# Patient Record
Sex: Female | Born: 1975 | Race: White | Hispanic: No | Marital: Married | Smoking: Never smoker
Health system: Southern US, Community
[De-identification: ages and names within clinical notes are randomized; demographics above are authoritative.]

## PROBLEM LIST (undated history)

## (undated) DIAGNOSIS — F32A Depression, unspecified: Secondary | ICD-10-CM

## (undated) DIAGNOSIS — E669 Obesity, unspecified: Secondary | ICD-10-CM

## (undated) DIAGNOSIS — F329 Major depressive disorder, single episode, unspecified: Secondary | ICD-10-CM

## (undated) HISTORY — DX: Obesity, unspecified: E66.9

## (undated) HISTORY — DX: Depression, unspecified: F32.A

## (undated) HISTORY — DX: Major depressive disorder, single episode, unspecified: F32.9

---

## 2008-07-13 ENCOUNTER — Ambulatory Visit: Payer: Self-pay | Admitting: Occupational Medicine

## 2009-02-15 ENCOUNTER — Ambulatory Visit: Payer: Self-pay | Admitting: Family Medicine

## 2010-04-26 ENCOUNTER — Ambulatory Visit: Payer: Self-pay | Admitting: Family Medicine

## 2010-04-26 ENCOUNTER — Other Ambulatory Visit: Admission: RE | Admit: 2010-04-26 | Discharge: 2010-04-26 | Payer: Self-pay | Admitting: Family Medicine

## 2010-04-26 DIAGNOSIS — F329 Major depressive disorder, single episode, unspecified: Secondary | ICD-10-CM

## 2010-04-26 DIAGNOSIS — E663 Overweight: Secondary | ICD-10-CM | POA: Insufficient documentation

## 2010-04-27 ENCOUNTER — Telehealth (INDEPENDENT_AMBULATORY_CARE_PROVIDER_SITE_OTHER): Payer: Self-pay | Admitting: *Deleted

## 2010-04-28 ENCOUNTER — Telehealth: Payer: Self-pay | Admitting: Family Medicine

## 2010-05-08 ENCOUNTER — Telehealth: Payer: Self-pay | Admitting: Family Medicine

## 2010-06-05 ENCOUNTER — Ambulatory Visit: Payer: Self-pay | Admitting: Family Medicine

## 2010-11-21 NOTE — Letter (Signed)
Summary: Depression Questionnaire  Depression Questionnaire   Imported By: Lanelle Bal 06/22/2010 09:36:51  _____________________________________________________________________  External Attachment:    Type:   Image     Comment:   External Document

## 2010-11-21 NOTE — Progress Notes (Signed)
Summary: Effexor is too expensive  Phone Note Call from Patient   Caller: Patient Summary of Call: Pt LMOM stating generic effexor is still too expensive bc she doesn't have any Rx coverage. Pt states her pharmacist recommended zoloft, wellbutrin or paxil for cost. Pt would like to know if you will Rx any of these meds. Please advise. Initial call taken by: Payton Spark CMA,  May 08, 2010 2:05 PM  Follow-up for Phone Call        will change her to sertraline. Follow-up by: Seymour Bars DO,  May 08, 2010 2:29 PM    New/Updated Medications: SERTRALINE HCL 50 MG TABS (SERTRALINE HCL) 1/2 tab by mouth daily x 1 wk then 1 tab by mouth daily Prescriptions: SERTRALINE HCL 50 MG TABS (SERTRALINE HCL) 1/2 tab by mouth daily x 1 wk then 1 tab by mouth daily  #30 x 1   Entered and Authorized by:   Seymour Bars DO   Signed by:   Seymour Bars DO on 05/08/2010   Method used:   Electronically to        [facility]* (retail)       [address]       [place], [place]  [address]       Ph: [phone]       Fax: [phone]   RxID:   [id number]   Appended Document: Effexor is too expensive Pt aware of the above

## 2010-11-21 NOTE — Progress Notes (Signed)
Summary: Cymbalta too expensive  Phone Note Call from Patient   Caller: Patient Summary of Call: Pt called stating Cymbalta will cost her $317/month and is unable to afford it. Pt would like Rx changed to something different. Please advise. Initial call taken by: Payton Spark CMA,  April 28, 2010 2:14 PM  Follow-up for Phone Call        changed her to the most equivalent RX -- generic effexor.  let me know if this is OK. Follow-up by: Seymour Bars DO,  April 28, 2010 2:21 PM    New/Updated Medications: VENLAFAXINE HCL 37.5 MG XR24H-CAP (VENLAFAXINE HCL) 1 capsule by mouth qAM x 1 wk then increase to 2 capsules by mouth qAM Prescriptions: VENLAFAXINE HCL 37.5 MG XR24H-CAP (VENLAFAXINE HCL) 1 capsule by mouth qAM x 1 wk then increase to 2 capsules by mouth qAM  #60 x 1   Entered and Authorized by:   Seymour Bars DO   Signed by:   Seymour Bars DO on 04/28/2010   Method used:   Electronically to        [facility]* (retail)       [address]       [place], [place]  [address]       Ph: [phone]       Fax: [phone]   RxID:   [id number]   Appended Document: Cymbalta too expensive Pt aware

## 2010-11-21 NOTE — Assessment & Plan Note (Signed)
Summary: f/u mood   Vital Signs:  Patient profile:   35 year old female Height:      67.5 inches Weight:      205 pounds BMI:     31.75 O2 Sat:      96 % on Room air Pulse rate:   64 / minute BP sitting:   120 / 82  (left arm) Cuff size:   large  Vitals Entered By: Payton Spark CMA (June 05, 2010 1:23 PM)  O2 Flow:  Room air CC: F/U depression   Primary Care Taiya Nutting:  Nani Gasser MD  CC:  F/U depression.  History of Present Illness: Rhonda Stein presents for f/u depression.  She was started on Sertraline about 5 wks ago.  She feels a lot better.  She denies any adverse SEs.  She is taking it at night.  Sleeping well.  She is on 50 mg/ day.  She feels much less irritable.  Her husband notices a big improvement in her mood.  Energy level has improved.  Current Medications (verified): 1)  Sertraline Hcl 50 Mg Tabs (Sertraline Hcl) .... Take 1 Tab By Mouth Once Daily  Allergies (verified): No Known Drug Allergies  Past History:  Past Medical History: G2P2 obesity depression  Social History: Reviewed history from 04/26/2010 and no changes required. 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 Has 2 kids. No exercise.   Finished school - teaching  Review of Systems Psych:  Denies anxiety, depression, easily angered, easily tearful, irritability, panic attacks, suicidal thoughts/plans, and thoughts of violence.  Physical Exam  General:  alert, well-developed, well-nourished, and well-hydrated.  obese Skin:  color normal.   Psych:  good eye contact, not anxious appearing, and not depressed appearing.     Impression & Recommendations:  Problem # 1:  DEPRESSION (ICD-311) PHQ9 improved from a 13--> 4 in 5 wks.  Doing great on Sertraline 50 mg/ day w/o adverse SE.  Will RF her for another 4 months and have her f/u with Dr Linford Arnold in 3 mos.  Call if any problems.   Her updated medication list for this problem includes:    Sertraline Hcl 50 Mg Tabs  (Sertraline hcl) .Marland Kitchen... Take 1 tab by mouth once daily  Complete Medication List: 1)  Sertraline Hcl 50 Mg Tabs (Sertraline hcl) .... Take 1 tab by mouth once daily  Patient Instructions: 1)  You are doing great on Sertraline. 2)  Continue on current dosage. 3)  REturn for f/u mood in 3 mos. Prescriptions: SERTRALINE HCL 50 MG TABS (SERTRALINE HCL) Take 1 tab by mouth once daily  #30 x 3   Entered and Authorized by:   Seymour Bars DO   Signed by:   Seymour Bars DO on 06/05/2010   Method used:   Electronically to        [facility]* (retail)       [address]       [place], [place]  [address]       Ph: [phone]       Fax: [phone]   RxID:   [id number]

## 2010-11-21 NOTE — Progress Notes (Signed)
Summary: cymbalta  Phone Note From Pharmacy   Caller: Patient Caller: [facility]* Summary of Call: Pharm called on behalf of Pt asking if Cymbalta can be changed to generic celexa due to cost. Please advise. Initial call taken by: Payton Spark CMA,  April 27, 2010 3:26 PM  Follow-up for Phone Call        no, I specifially wanted her to be on an SNRI.   Follow-up by: Seymour Bars DO,  April 27, 2010 8:41 PM  Additional Follow-up for Phone Call Additional follow up Details #1::        [facility] informing pharm. Additional Follow-up by: Payton Spark CMA,  April 28, 2010 9:29 AM

## 2010-11-21 NOTE — Assessment & Plan Note (Signed)
Summary: CPE with pap   Vital Signs:  Patient profile:   35 year old female LMP:     04/12/2010 Height:      67.5 inches Weight:      203.75 pounds BMI:     31.55 Temp:     97.6 degrees F oral Pulse rate:   92 / minute Pulse rhythm:   regular Resp:     18 per minute BP sitting:   117 / 87  (right arm) Cuff size:   large  Vitals Entered By: Mervin Kung CMA Duncan Dull) (April 26, 2010 9:35 AM) Is Patient Diabetic? No LMP (date): 04/12/2010     Enter LMP: 04/12/2010   Primary Care Provider:  Nani Gasser MD   History of Present Illness: 35 yo WF presents for CPE with pap smear.  She has a Mirena IUD from 2007 for contraception.  Her periods are light.    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  With all the stress of the move, she feels depressed.  Never on meds before.  Her husband is her support system.  Denies fam hx of colon or breast cancer or of premature heart dz.  Last Tetanus unknown, declined today.  She is due for fasting labs.      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 No exercise.   Finished school - teaching  Review of Systems       The patient complains of weight gain.  The patient denies anorexia, fever, weight loss, vision loss, decreased hearing, hoarseness, chest pain, syncope, dyspnea on exertion, peripheral edema, prolonged cough, headaches, hemoptysis, abdominal pain, melena, hematochezia, severe indigestion/heartburn, hematuria, incontinence, genital sores, muscle weakness, suspicious skin lesions, transient blindness, difficulty walking, depression, unusual weight change, abnormal bleeding, enlarged lymph nodes, angioedema, breast masses, and testicular  masses.    Physical Exam  General:  alert, well-developed, well-nourished, well-hydrated, and overweight-appearing.   Head:  normocephalic and atraumatic.   Eyes:  pupils equal, pupils round, and pupils reactive to light.   Ears:  no external deformities.   Nose:  no nasal discharge.   Mouth:  good dentition and pharynx pink and moist.   Neck:  no masses.   Breasts:  No mass, nodules, thickening, tenderness, bulging, retraction, inflamation, nipple discharge or skin changes noted.   Lungs:  Normal respiratory effort, chest expands symmetrically. Lungs are clear to auscultation, no crackles or wheezes. Heart:  Normal rate and regular rhythm. S1 and S2 normal without gallop, murmur, click, rub or other extra sounds. Abdomen:  Bowel sounds positive,abdomen soft and non-tender without masses, organomegaly or hernias noted. Genitalia:  Pelvic Exam:        External: normal female genitalia without lesions or masses        Vagina: normal without lesions or masses        Cervix: normal without lesions or masses; IUD string visualized        Adnexa: normal bimanual exam without masses or fullness        Uterus: normal by palpation        Pap smear: performed Pulses:  2+ radial and pedal pulses Extremities:  no LE edema Skin:  color normal.   Cervical  Nodes:  No lymphadenopathy noted Psych:  good eye contact, not anxious appearing, and flat affect.     Impression & Recommendations:  Problem # 1:  ROUTINE GYNECOLOGICAL EXAMINATION (ICD-V72.31) Keeping healthy checklist for women reviewed today. BP at goal.  BMI 31= class I obesity. Counseled on healthy diet, regular exercise. MVI + Calcium with D daily encouraged. Update fasting labs. Td historically UTD. IUD for contraception, to be removed in 1 yr...she is considering getting another Mirena vs husband getting a vasectomy. PHQ-9 done for depression = 13 c/w moderate depression.  Start on cymbalta and f/u in 4 wks.  Call if any  problems.  Complete Medication List: 1)  Cymbalta 30 Mg Cpep (Duloxetine hcl) .Marland Kitchen.. 1 capsule by mouth once a day x 7 days then 2 capsules by mouth once a day  Other Orders: T-Comprehensive Metabolic Panel [phone]) T-Lipid Profile ([id number]) T-TSH ([id number])  Patient Instructions: 1)  Start on Cymbalta 30 mg once a day for depression x 7 days. 2)  After 7 days, increase to 2 capsules by mouth (60 mg) once a day.  Take this in the morning. 3)  Call if any problems. 4)  Return for f/u depression in 4 wks. Prescriptions: CYMBALTA 30 MG CPEP (DULOXETINE HCL) 1 capsule by mouth once a day x 7 days then 2 capsules by mouth once a day  #60 x 1   Entered and Authorized by:   Seymour Bars DO   Signed by:   Seymour Bars DO on 04/26/2010   Method used:   Electronically to        [facility] [phone]* (retail)       [address]       [place], [place]  [address]       Ph: [phone] or [phone]       Fax: [phone]   RxID:   [id number]      Vital Signs:  Patient Profile:   35 year old female LMP:     04/12/2010 Height:     67.5 inches Weight:      203.75 pounds BMI:     31.55 Temp:     97.6 degrees F oral Pulse rate:   92 / minute Pulse rhythm:   regular Resp:     18 per minute BP sitting:   117 / 87 Cuff size:   large                 Current Allergies (reviewed today): No known allergies

## 2010-11-21 NOTE — Letter (Signed)
Summary: Depression Questionnaire  Depression Questionnaire   Imported By: Lanelle Bal 06/13/2010 10:06:25  _____________________________________________________________________  External Attachment:    Type:   Image     Comment:   External Document

## 2010-12-05 ENCOUNTER — Ambulatory Visit: Payer: Self-pay | Admitting: Family Medicine

## 2011-01-07 ENCOUNTER — Encounter: Payer: Self-pay | Admitting: Family Medicine

## 2011-01-16 ENCOUNTER — Ambulatory Visit: Payer: Self-pay | Admitting: Family Medicine

## 2011-01-17 ENCOUNTER — Encounter: Payer: Self-pay | Admitting: Obstetrics & Gynecology

## 2011-01-17 ENCOUNTER — Encounter (INDEPENDENT_AMBULATORY_CARE_PROVIDER_SITE_OTHER): Payer: PRIVATE HEALTH INSURANCE | Admitting: Obstetrics & Gynecology

## 2011-01-17 DIAGNOSIS — Z30433 Encounter for removal and reinsertion of intrauterine contraceptive device: Secondary | ICD-10-CM

## 2011-01-23 NOTE — Assessment & Plan Note (Signed)
NAMESHAKENA, CALLARI NO.:  [id number]  MEDICAL RECORD NO.:  [id number]          PATIENT TYPE:  LOCATION:  [facility] [place]           FACILITY:  PHYSICIAN:  Allie Bossier, MD             DATE OF BIRTH:  DATE OF SERVICE:  01/17/2011                                 CLINIC NOTE  Ms. Washinton is a 35 year old married lady.  She is a married white G3 P2 A1 who has had an IUD (a Mirena IUD) for approximately 5 years.  She would like to have it replaced. Dr. Linford Arnold is her family doctor, and she is up to date on her Pap smear and her routine health maintenance.  She has no complaints about the Mirena and wishes to continue this as her form of birth control. Bimanual exam revealed a normal size and shape, anteverted, mobile uterus and nonenlarged adnexa.  A speculum was placed, her cervix was covered with iodine, and the IUD was removed.  I then was able to place atraumatically (without tenaculum) a new Mirena IUD, the strings were cut to approximately 3 cm.  She tolerated the procedure well.  She will follow up in a year or when her annual exam is needed, or sooner as necessary.     Allie Bossier, MD    MCD/MEDQ  D:  01/17/2011  T:  01/17/2011  Job:  [id number]

## 2011-09-04 ENCOUNTER — Other Ambulatory Visit: Payer: Self-pay | Admitting: Endodontics

## 2012-02-08 ENCOUNTER — Encounter: Payer: PRIVATE HEALTH INSURANCE | Admitting: Family Medicine

## 2012-02-15 ENCOUNTER — Encounter: Payer: PRIVATE HEALTH INSURANCE | Admitting: Family Medicine

## 2012-03-14 ENCOUNTER — Encounter: Payer: Self-pay | Admitting: Family Medicine

## 2012-03-14 ENCOUNTER — Other Ambulatory Visit (HOSPITAL_COMMUNITY)
Admission: RE | Admit: 2012-03-14 | Discharge: 2012-03-14 | Disposition: A | Discharge status: Home / Self Care | Payer: PRIVATE HEALTH INSURANCE | Source: Ambulatory Visit | Attending: Family Medicine | Admitting: Family Medicine

## 2012-03-14 ENCOUNTER — Ambulatory Visit (INDEPENDENT_AMBULATORY_CARE_PROVIDER_SITE_OTHER): Payer: PRIVATE HEALTH INSURANCE | Admitting: Family Medicine

## 2012-03-14 VITALS — BP 126/83 | HR 71 | Ht 67.5 in | Wt 175.0 lb

## 2012-03-14 DIAGNOSIS — Z01419 Encounter for gynecological examination (general) (routine) without abnormal findings: Secondary | ICD-10-CM

## 2012-03-14 DIAGNOSIS — Z23 Encounter for immunization: Secondary | ICD-10-CM

## 2012-03-14 MED ORDER — PHENTERMINE HCL 37.5 MG PO TABS: 37.5000 mg | ORAL_TABLET | Freq: Every day | ORAL | Status: DC

## 2012-03-14 NOTE — Patient Instructions (Signed)
Start a regular exercise program and make sure you are eating a healthy diet Try to eat 4 servings of dairy a day  Your vaccines are up to date.  We will call you with your lab results. If you don't here from us in about a week then please give us a call at [phone].    

## 2012-03-14 NOTE — Progress Notes (Signed)
Subjective:     Rhonda Stein is a 36 y.o. female and is here for a comprehensive physical exam. The patient reports she wants to continue to work on her weight.  She has lost 30 lbs in the last 6 months. She previsouly took phentermine 1/2 tab every other day. Says shtink about taking it again for 1-2 more months to see if can get the last 15 lbs off.  Working out 5 times a week.  Says really eats healthy.   History   Social History  . Marital Status: Married    Spouse Name: N/A    Number of Children: N/A  . Years of Education: N/A   Occupational History  . Not on file.   Social History Main Topics  . Smoking status: Never Smoker   . Smokeless tobacco: Not on file  . Alcohol Use: Not on file  . Drug Use: No  . Sexually Active: Not on file   Other Topics Concern  . Not on file   Social History Narrative   Has been working out.     Health Maintenance  Topic Date Due  . Tetanus/tdap  10/20/1993  . Influenza Vaccine  07/22/2012  . Pap Smear  04/26/2013    The following portions of the patient's history were reviewed and updated as appropriate: allergies, current medications, past family history, past medical history, past social history, past surgical history and problem list.  Review of Systems Pertinent items are noted in HPI.   Objective:    BP 126/83  Pulse 71  Ht 5' 7.5" (1.715 m)  Wt 175 lb (79.379 kg)  BMI 27.00 kg/m2  LMP 02/13/2012 General appearance: alert, cooperative and appears stated age Head: Normocephalic, without obvious abnormality, atraumatic Eyes: conj clear, EOMi, PEERLA Ears: normal TM's and external ear canals both ears Nose: Nares normal. Septum midline. Mucosa normal. No drainage or sinus tenderness. Throat: lips, mucosa, and tongue normal; teeth and gums normal Neck: no adenopathy, no carotid bruit, no JVD, supple, symmetrical, trachea midline and thyroid not enlarged, symmetric, no tenderness/mass/nodules Back: symmetric, no curvature. ROM  normal. No CVA tenderness. Lungs: clear to auscultation bilaterally Breasts: normal appearance, no masses or tenderness Heart: regular rate and rhythm, S1, S2 normal, no murmur, click, rub or gallop Abdomen: soft, non-tender; bowel sounds normal; no masses,  no organomegaly Pelvic: cervix normal in appearance, external genitalia normal, no adnexal masses or tenderness, no cervical motion tenderness, rectovaginal septum normal, uterus normal size, shape, and consistency and vagina normal without discharge Extremities: extremities normal, atraumatic, no cyanosis or edema Pulses: 2+ and symmetric Skin: Skin color, texture, turgor normal. No rashes or lesions Lymph nodes: Cervical, supraclavicular, and axillary nodes normal. Neurologic: Grossly normal    Assessment:    Healthy female exam.     Plan:     See After Visit Summary for Counseling Recommendations  Start a regular exercise program and make sure you are eating a healthy diet Try to eat 4 servings of dairy a day or take a calcium supplement (500mg  twice a day). Your vaccines are up to date.   Tdap given today.   Screening labwork.    Weight loss-overhead and represcribed the phentermine. She typically takes about half of a tab every other day and says this actually works well. We will renew this. Next on her importance of this in conjunction with regular exercise and healthy diet which she is already doing. Followup in one month for blood pressure and weight check if she  decides she wants a refill.

## 2013-01-07 ENCOUNTER — Telehealth: Payer: Self-pay | Admitting: *Deleted

## 2013-01-07 DIAGNOSIS — Z0000 Encounter for general adult medical examination without abnormal findings: Secondary | ICD-10-CM

## 2013-01-07 NOTE — Telephone Encounter (Signed)
Pt calls and states she wants to get labs done ? Check hormone levels and routine labs. States she is tired all the time, weight gain and turning 38. Will make appointment with you for CPE once gets labs done

## 2013-01-07 NOTE — Telephone Encounter (Signed)
OK for TSH, CMP, lipids, fsh, lh, estradiol, progesterone.  Can use v70.0 and fatigue.  Have her go ahead and schedule the appointment today. I am a little leery if they want lab work done and they haven't  scheduled an appointment yet.

## 2013-01-08 NOTE — Telephone Encounter (Signed)
Pt notified and labs faxed. Barry Dienes, LPN

## 2013-01-13 LAB — COMPREHENSIVE METABOLIC PANEL
AST: 15 U/L (ref 0–37)
Albumin: 4.3 g/dL (ref 3.5–5.2)
Alkaline Phosphatase: 52 U/L (ref 39–117)
Chloride: 106 mEq/L (ref 96–112)
Glucose, Bld: 90 mg/dL (ref 70–99)
Potassium: 4.5 mEq/L (ref 3.5–5.3)
Sodium: 140 mEq/L (ref 135–145)
Total Protein: 6.6 g/dL (ref 6.0–8.3)

## 2013-01-13 LAB — LIPID PANEL
LDL Cholesterol: 135 mg/dL — ABNORMAL HIGH (ref 0–99)
Triglycerides: 62 mg/dL (ref <150)

## 2013-01-13 LAB — FOLLICLE STIMULATING HORMONE: FSH: 6.4 m[IU]/mL

## 2013-04-22 ENCOUNTER — Other Ambulatory Visit (HOSPITAL_COMMUNITY)
Admission: RE | Admit: 2013-04-22 | Discharge: 2013-04-22 | Disposition: A | Discharge status: Home / Self Care | Payer: PRIVATE HEALTH INSURANCE | Source: Ambulatory Visit | Attending: Family Medicine | Admitting: Family Medicine

## 2013-04-22 ENCOUNTER — Ambulatory Visit (INDEPENDENT_AMBULATORY_CARE_PROVIDER_SITE_OTHER): Payer: PRIVATE HEALTH INSURANCE | Admitting: Family Medicine

## 2013-04-22 ENCOUNTER — Encounter: Payer: Self-pay | Admitting: Family Medicine

## 2013-04-22 VITALS — BP 122/85 | HR 78 | Ht 68.0 in | Wt 192.0 lb

## 2013-04-22 DIAGNOSIS — Z01419 Encounter for gynecological examination (general) (routine) without abnormal findings: Secondary | ICD-10-CM

## 2013-04-22 DIAGNOSIS — H6123 Impacted cerumen, bilateral: Secondary | ICD-10-CM

## 2013-04-22 DIAGNOSIS — Z1151 Encounter for screening for human papillomavirus (HPV): Secondary | ICD-10-CM | POA: Insufficient documentation

## 2013-04-22 DIAGNOSIS — R87615 Unsatisfactory cytologic smear of cervix: Secondary | ICD-10-CM

## 2013-04-22 DIAGNOSIS — R635 Abnormal weight gain: Secondary | ICD-10-CM

## 2013-04-22 DIAGNOSIS — F329 Major depressive disorder, single episode, unspecified: Secondary | ICD-10-CM

## 2013-04-22 DIAGNOSIS — F3289 Other specified depressive episodes: Secondary | ICD-10-CM

## 2013-04-22 DIAGNOSIS — H6120 Impacted cerumen, unspecified ear: Secondary | ICD-10-CM

## 2013-04-22 DIAGNOSIS — Z124 Encounter for screening for malignant neoplasm of cervix: Secondary | ICD-10-CM

## 2013-04-22 MED ORDER — FLUOXETINE HCL 10 MG PO CAPS: ORAL_CAPSULE | ORAL | Status: DC

## 2013-04-22 NOTE — Patient Instructions (Signed)
Keep up a regular exercise program and make sure you are eating a healthy diet Try to eat 4 servings of dairy a day, or if you are lactose intolerant take a calcium with vitamin D daily.  Your vaccines are up to date.   

## 2013-04-22 NOTE — Progress Notes (Addendum)
Subjective:    Patient ID: Rhonda Stein, female    DOB: [birth year], 38 y.o.   MRN: [id number]  HPI    Review of Systems     Objective:   Physical Exam        Assessment & Plan:   Subjective:     Rhonda Stein is a 37 y.o. female and is here for a comprehensive physical exam. The patient reports no problems.  Feels like her ears are impacted and feels he rhearing is decreased. Has to get her ears cleaned out in the past.    She is really struggling emotionally. She has a lot of days where she feels down. She cries easily. She's also more stressed and irritable. Her life is very stressful. 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 She's also having some difficulty with her marriage. She has decreased exercise. She has difficulty with losing weight. She really struggles with this. She would like to have hormone testing done. We can order this in March but unfortunately her shunt progesterone were not run, though her [facility] and LH were normal. We will check this again today. She denies any suicidal thoughts.  History   Social History  . Marital Status: Married    Spouse Name: N/A    Number of Children: N/A  . Years of Education: N/A   Occupational History  . Not on file.   Social History Main Topics  . Smoking status: Never Smoker   . Smokeless tobacco: Not on file  . Alcohol Use: Not on file  . Drug Use: No  . Sexually Active: Not on file   Other Topics Concern  . Not on file   Social History Narrative   Has been working out.     Health Maintenance  Topic Date Due  . Influenza Vaccine  06/22/2013  . Pap Smear  03/15/2015  . Tetanus/tdap  03/14/2022    The following portions of the patient's history were reviewed and updated as appropriate: allergies, current medications, past family history, past medical history, past social history, past surgical history and problem list.  Review of Systems A comprehensive review  of systems was negative.   Objective:    BP 122/85  Pulse 78  Ht 5\' 8"  (1.727 m)  Wt 192 lb (87.091 kg)  BMI 29.2 kg/m2 General appearance: alert, cooperative and appears stated age Head: atraumatic Eyes: conj clear, EOMi, PEERLA Ears: Bilateral canals blocked with cerumen. Nose: Nares normal. Septum midline. Mucosa normal. No drainage or sinus tenderness. Throat: lips, mucosa, and tongue normal; teeth and gums normal Neck: no adenopathy, no carotid bruit, no JVD, supple, symmetrical, trachea midline and thyroid not enlarged, symmetric, no tenderness/mass/nodules Back: symmetric, no curvature. ROM normal. No CVA tenderness. Lungs: clear to auscultation bilaterally Breasts: normal appearance, no masses or tenderness Heart: regular rate and rhythm, S1, S2 normal, no murmur, click, rub or gallop Abdomen: soft, non-tender; bowel sounds normal; no masses,  no organomegaly Pelvic: cervix normal in appearance, external genitalia normal, no adnexal masses or tenderness, no cervical motion tenderness, rectovaginal septum normal, uterus normal size, shape, and consistency, vagina normal without discharge and visualized her IUD strings Extremities: extremities normal, atraumatic, no cyanosis or edema Pulses: 2+ and symmetric Skin: Skin color, texture, turgor normal. No rashes or lesions Lymph nodes: Cervical, supraclavicular, and axillary nodes normal. Neurologic: Grossly normal    Assessment:    Healthy female exam.  Plan:     See After Visit Summary for Counseling Recommendations  Keep up a regular exercise program and make sure you are eating a healthy diet Try to eat 4 servings of dairy a day, or if you are lactose intolerant take a calcium with vitamin D daily.  Your vaccines are up to date.   Acute situational depression-PHQ- 9 score of 12.  We discussed options. Counseling versus medication. She is at the point where she would like to consider a medication. We'll start  fluoxetine. We discussed potential side effects of the medication. She was in the back in one month so we can see how she's doing and adjust her dose if needed. She would also like to have her estrogen, progesterone and testosterone checked as well.  Cerumen impaction-ears were irrigated. Patient tolerated well.

## 2013-05-04 LAB — PROGESTERONE: Progesterone: 3.8 ng/mL

## 2013-05-04 LAB — ESTRADIOL: Estradiol: 72.6 pg/mL

## 2014-02-26 ENCOUNTER — Encounter: Payer: Self-pay | Admitting: Family Medicine

## 2014-02-26 ENCOUNTER — Ambulatory Visit (INDEPENDENT_AMBULATORY_CARE_PROVIDER_SITE_OTHER): Payer: PRIVATE HEALTH INSURANCE | Admitting: Family Medicine

## 2014-02-26 VITALS — BP 108/69 | HR 74 | Wt 208.0 lb

## 2014-02-26 DIAGNOSIS — M722 Plantar fascial fibromatosis: Secondary | ICD-10-CM

## 2014-02-26 DIAGNOSIS — E663 Overweight: Secondary | ICD-10-CM

## 2014-02-26 DIAGNOSIS — H6120 Impacted cerumen, unspecified ear: Secondary | ICD-10-CM

## 2014-02-26 MED ORDER — PHENTERMINE HCL 37.5 MG PO CAPS: 37.5000 mg | ORAL_CAPSULE | ORAL | Status: DC

## 2014-02-26 NOTE — Progress Notes (Signed)
   Subjective:    Patient ID: Rhonda Stein, female    DOB: 1975/10/28, 38 y.o.   MRN: [id number]  HPI Cerumen impaction - she gets recurrent cerumen impaction of the ears. She's here today to have them cleaned out.  She's also had some heel pain on her right foot for a while. She says it's not debilitating but does bother her from time to time. She went to the [employer] shoe store and actually bought an insert. She does feel like that has actually helped but wants to know what all she can do to help with her pain. It's worse when she first gets out of bed and steps on her feet in the morning. Near her heel but is also having a little bit of pain in the ball of the foot. No trauma or injury. She does wear a lot of flip-flops.  Obesity, class I-she really wants to work on her weight. She says she's gained 30 pounds in about 15 months. She's taken phentermine before and tolerated it well without any chest pain or palpitations or shortness of breath. She said she's been under a lot of stress and has been stress eating. She really wants something to help curb her appetite. She's not currently exercising. Review of Systems     Objective:   Physical Exam  Constitutional: She is oriented to person, place, and time. She appears well-developed and well-nourished.  HENT:  Head: Normocephalic and atraumatic.  Cardiovascular: Normal rate, regular rhythm and normal heart sounds.   Pulmonary/Chest: Effort normal and breath sounds normal.  Neurological: She is alert and oriented to person, place, and time.  Skin: Skin is warm and dry.  Psychiatric: She has a normal mood and affect. Her behavior is normal.          Assessment & Plan:  Cerumen impaction - bilateral irrigation performed today. Patient tolerated well. Return if needed. May want to use over-the-counter D. proximal drops periodically just to keep the wax soft and it may help facilitate the wax coming to the external canal.  Plantar  fascititis - discussed treatment options. Recommend stretches. Handout provided. Icing the area was frozen water bottle. Also recommend anti-inflammatory. Recommend that she quit wearing flip-flops and continue to wear good supportive shoes with her inserts. If not noticing significant improvement over 3-4 weeks then recommend she see our sports medicine doctor for possible injection.  Obesity, class I-will restart phentermine but will need to be in combination with an exercise regimen and a diet program. Ask reminded her that this cannot be used solely it has to be part of a comprehensive plan. Followup in one month for blood pressure and weight check with nurse. Stop immediately if any chest pain or shortness of breath.

## 2014-03-26 ENCOUNTER — Ambulatory Visit (INDEPENDENT_AMBULATORY_CARE_PROVIDER_SITE_OTHER): Payer: PRIVATE HEALTH INSURANCE | Admitting: Family Medicine

## 2014-03-26 ENCOUNTER — Encounter: Payer: Self-pay | Admitting: *Deleted

## 2014-03-26 VITALS — BP 128/80 | HR 83 | Wt 190.0 lb

## 2014-03-26 DIAGNOSIS — R635 Abnormal weight gain: Secondary | ICD-10-CM

## 2014-03-26 MED ORDER — PHENTERMINE HCL 37.5 MG PO CAPS: 37.5000 mg | ORAL_CAPSULE | ORAL | Status: DC

## 2014-03-26 NOTE — Progress Notes (Signed)
   Subjective:    Patient ID: Rhonda Stein, female    DOB: 1976/01/10, 38 y.o.   MRN: [id number]  HPI   Patient doing well on appetite suppressant.  Here for nurse visit, weight, BP, HR check.  Denies problems with insomnia, heart palpitations or tremors.  Satisfied with weight loss thus far and is working on [employer] and regular exercise.     Review of Systems     Objective:   Physical Exam        Assessment & Plan:  Abnormal weight gain-she has lost 18 pounds and is doing fantastic on the medication without any side effects and blood pressure is well-controlled. Continue current regimen. Refill sent today. Followup in one month for blood pressure and weight check.

## 2014-04-26 ENCOUNTER — Ambulatory Visit: Payer: PRIVATE HEALTH INSURANCE

## 2014-08-19 ENCOUNTER — Encounter: Payer: Self-pay | Admitting: Family Medicine

## 2014-08-19 ENCOUNTER — Ambulatory Visit (INDEPENDENT_AMBULATORY_CARE_PROVIDER_SITE_OTHER): Payer: 59 | Admitting: Family Medicine

## 2014-08-19 VITALS — BP 134/78 | HR 83 | Ht 68.0 in | Wt 184.0 lb

## 2014-08-19 DIAGNOSIS — K2100 Gastro-esophageal reflux disease with esophagitis, without bleeding: Secondary | ICD-10-CM

## 2014-08-19 DIAGNOSIS — S2341XA Sprain of ribs, initial encounter: Secondary | ICD-10-CM

## 2014-08-19 DIAGNOSIS — G4700 Insomnia, unspecified: Secondary | ICD-10-CM

## 2014-08-19 MED ORDER — OMEPRAZOLE 40 MG PO CPDR: 40.0000 mg | DELAYED_RELEASE_CAPSULE | Freq: Every day | ORAL | Status: DC

## 2014-08-19 NOTE — Patient Instructions (Signed)
Food Choices for Gastroesophageal Reflux Disease When you have gastroesophageal reflux disease (GERD), the foods you eat and your eating habits are very important. Choosing the right foods can help ease the discomfort of GERD. WHAT GENERAL GUIDELINES DO I NEED TO FOLLOW?  Choose fruits, vegetables, whole grains, low-fat dairy products, and low-fat meat, fish, and poultry.  Limit fats such as oils, salad dressings, butter, nuts, and avocado.  Keep a food diary to identify foods that cause symptoms.  Avoid foods that cause reflux. These may be different for different people.  Eat frequent small meals instead of three large meals each day.  Eat your meals slowly, in a relaxed setting.  Limit fried foods.  Cook foods using methods other than frying.  Avoid drinking alcohol.  Avoid drinking large amounts of liquids with your meals.  Avoid bending over or lying down until 2-3 hours after eating. WHAT FOODS ARE NOT RECOMMENDED? The following are some foods and drinks that may worsen your symptoms: Vegetables Tomatoes. Tomato juice. Tomato and spaghetti sauce. Chili peppers. Onion and garlic. Horseradish. Fruits Oranges, grapefruit, and lemon (fruit and juice). Meats High-fat meats, fish, and poultry. This includes hot dogs, ribs, ham, sausage, salami, and bacon. Dairy Whole milk and chocolate milk. Sour cream. Cream. Butter. Ice cream. Cream cheese.  Beverages Coffee and tea, with or without caffeine. Carbonated beverages or energy drinks. Condiments Hot sauce. Barbecue sauce.  Sweets/Desserts Chocolate and cocoa. Donuts. Peppermint and spearmint. Fats and Oils High-fat foods, including [place] fries and potato chips. Other Vinegar. Strong spices, such as black pepper, white pepper, red pepper, cayenne, curry powder, cloves, ginger, and chili powder. The items listed above may not be a complete list of foods and beverages to avoid. Contact your dietitian for more  information. Document Released: 10/08/2005 Document Revised: 10/13/2013 Document Reviewed: 08/12/2013 [facility] Patient Information 2015 [place], [place]. This information is not intended to replace advice given to you by your health care provider. Make sure you discuss any questions you have with your health care provider.

## 2014-08-19 NOTE — Progress Notes (Signed)
Subjective:    Patient ID: Rhonda Stein, female    DOB: Jul 31, 1976, 38 y.o.   MRN: [id number]  HPI 6 weeks of feeling her food passing through her chest and then having more lower chest discomfort after eating. Says can feel it reflux up sometims.  It comes and goes.  Occ takes a half a phentermine. She denies any major weight changes.  Says can feel her breathing in her throat. No ST. No fever, chils or sweat. No cough.  No URI.   Left sided CP - has been sore the the last 2 months. No trauma or injury.  + tender to touch.  She's not sure when it started. [place] She said sometimes it seems to get better and then other times it seems more sore.  Not sleeping well.  She feels like her stress levels are really high. 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   She did try some over-the-counter Benadryl and some melatonin. She did try it for couple of nights but wasn't convinced it was working or not. She does have a new dog as well that required surgery get up every 2-3 hours at night to take it to the potty.  Review of Systems     Objective:   Physical Exam  Constitutional: She is oriented to person, place, and time. She appears well-developed and well-nourished.  HENT:  Head: Normocephalic and atraumatic.  Right Ear: External ear normal.  Left Ear: External ear normal.  Nose: Nose normal.  Mouth/Throat: Oropharynx is clear and moist.  TMs and canals are clear.   Eyes: Conjunctivae and EOM are normal. Pupils are equal, round, and reactive to light.  Neck: Neck supple. No thyromegaly present.  Cardiovascular: Normal rate, regular rhythm and normal heart sounds.   Pulmonary/Chest: Effort normal and breath sounds normal. She has no wheezes.  Abdominal: Soft. Bowel sounds are normal. She exhibits no distension and no mass. There is no tenderness.  There is no rebound and no guarding.  Lymphadenopathy:    She has no cervical adenopathy.  Neurological: She is alert and oriented to person, place, and time.  Skin: Skin is warm and dry.  Psychiatric: She has a normal mood and affect.          Assessment & Plan:  GERD with esophagitis-recommended treatment of Prilosec 40 mg daily. Take 20-30 minutes before first meal of the day. Whenever foods to avoid and provided additional handout with information about reflux. If she's not improving over the next 2 weeks and please give [place] a call back. I suspect she will notice improvement within the next 4-5 days. Next  Insomnia-clearly related to her increased stress levels. We discussed strategies to lower her stress levels. Also reviewed some sleep hygiene such as going to bed at the same time and waking up at the same time and creating a good bedtime routine for herself. Certainly it's worth trying over-the-counter medication such as Benadryl, melatonin, and valerian root. These can often times be very helpful. If she's not still sleeping well and is really struggling a week consider a sleep aid such as trazodone in the future.  Left-sided chest pain most consistent with costochondritis. Unable to palpate right over the edge of the rib where it attaches to the sternum and she's very tender. Explained that  we typically use NSAIDs to help treat this. They with her increase in reflux I think we should hold off. Just avoid any straining or heavy lifting and try to give this time to heal. Certainly is not improving or if her pain becomes more persistent then we can get an x-ray.

## 2015-04-23 ENCOUNTER — Emergency Department (INDEPENDENT_AMBULATORY_CARE_PROVIDER_SITE_OTHER)
Admission: EM | Admit: 2015-04-23 | Discharge: 2015-04-23 | Disposition: A | Discharge status: Home / Self Care | Payer: 59 | Source: Home / Self Care | Attending: Family Medicine | Admitting: Family Medicine

## 2015-04-23 ENCOUNTER — Encounter: Payer: Self-pay | Admitting: Emergency Medicine

## 2015-04-23 DIAGNOSIS — R21 Rash and other nonspecific skin eruption: Secondary | ICD-10-CM

## 2015-04-23 DIAGNOSIS — J040 Acute laryngitis: Secondary | ICD-10-CM

## 2015-04-23 MED ORDER — MAGIC MOUTHWASH W/LIDOCAINE: 5.0000 mL | Freq: Three times a day (TID) | ORAL | Status: DC | PRN

## 2015-04-23 MED ORDER — BENZONATATE 100 MG PO CAPS: 100.0000 mg | ORAL_CAPSULE | Freq: Three times a day (TID) | ORAL | Status: DC

## 2015-04-23 NOTE — Discharge Instructions (Signed)
You may take Ibuprofen (Motrin) every 6-8 hours for fever and pain  Alternate with Tylenol  You may take Tylenol every 4-6 hours as needed for fever and pain  Follow-up with your primary care provider next week for recheck of symptoms if not improving.  Be sure to drink plenty of fluids and rest, at least 8hrs of sleep a night, preferably more while you are sick. Please got to the ER  if you cannot keep down fluids/signs of dehydration, fever not reducing with Tylenol, difficulty breathing/wheezing, stiff neck, worsening condition, or other concerns (see below)

## 2015-04-23 NOTE — ED Notes (Signed)
Patient presents to [facility] with C/O sore throat with cough cold and congestion for two days.

## 2015-04-23 NOTE — ED Provider Notes (Signed)
CSN: [id number]     Arrival date & time 04/23/15  2585 History   First MD Initiated Contact with Patient 04/23/15 [phone]     Chief Complaint  Patient presents with  . Sore Throat   (Consider location/radiation/quality/duration/timing/severity/associated sxs/prior Treatment) HPI  Pt is a 39yo female presenting to UC with c/o gradually worsening sore throat with change in voice and dry cough with chest tightness for 2 days.  Pt reports getting back from  [facility] yesterday and notes the weather was hot and very dry.  States she was yelling and screaming a lot during her vacation, having fun. Throat pain is gradually worsening, moderate in severity today.  States also thinks she got sun poisoning on her arms as she has developed a red pruritic rash that is mild in severity.  She has not tried any medications at home for her rash but she has tried ibuprofen for her throat pain.  Minimal relief.  No sick contacts that she knows of.  Denies difficulty breathing or swallowing but does note throat pain is worse with swallowing.  Denies hx of asthma and is not a smoker.   Past Medical History  Diagnosis Date  . Depression   . Obesity    History reviewed. No pertinent past surgical history. History reviewed. No pertinent family history. History  Substance Use Topics  . Smoking status: Never Smoker   . Smokeless tobacco: Not on file  . Alcohol Use: Not on file   OB History    No data available     Review of Systems  Constitutional: Positive for fatigue. Negative for fever, chills, diaphoresis and appetite change.  HENT: Positive for sore throat and voice change. Negative for congestion, ear pain, postnasal drip, rhinorrhea, sinus pressure, tinnitus and trouble swallowing.   Respiratory: Positive for cough and chest tightness. Negative for shortness of breath, wheezing and stridor.   Cardiovascular: Negative for chest pain, palpitations and leg swelling.  Gastrointestinal: Negative for nausea,  vomiting, abdominal pain and diarrhea.  Musculoskeletal: Negative for neck pain and neck stiffness.  Skin: Positive for color change and rash. Negative for pallor and wound.       Redness on bilateral arms  Neurological: Positive for light-headedness. Negative for dizziness, syncope, weakness, numbness and headaches.    Allergies  Review of patient's allergies indicates no known allergies.  Home Medications   Prior to Admission medications   Medication Sig Start Date End Date Taking? Authorizing Provider  Alum & Mag Hydroxide-Simeth (MAGIC MOUTHWASH W/LIDOCAINE) SOLN Take 5 mLs by mouth 3 (three) times daily as needed for mouth pain. May gargle and spit or swallow 04/23/15   Noland Fordyce, PA-C  benzonatate (TESSALON) 100 MG capsule Take 1 capsule (100 mg total) by mouth every 8 (eight) hours. 04/23/15   Noland Fordyce, PA-C  levonorgestrel (MIRENA) 20 MCG/24HR IUD 1 each by Intrauterine route once.    Historical Provider, MD  omeprazole (PRILOSEC) 40 MG capsule Take 1 capsule (40 mg total) by mouth daily. 08/19/14   Hali Marry, MD   BP 142/86 mmHg  Pulse 70  Temp(Src) 98.6 F (37 C) (Oral)  Resp 16  Ht 5' 8"  (1.727 m)  Wt 191 lb 4 oz (86.75 kg)  BMI 29.09 kg/m2  SpO2 100% Physical Exam  Constitutional: She appears well-developed and well-nourished. No distress.  HENT:  Head: Normocephalic and atraumatic.  Right Ear: Hearing, tympanic membrane, external ear and ear canal normal.  Left Ear: Hearing, tympanic membrane, external ear and ear  canal normal.  Nose: Nose normal.  Mouth/Throat: Uvula is midline and mucous membranes are normal. Posterior oropharyngeal erythema present. No oropharyngeal exudate, posterior oropharyngeal edema or tonsillar abscesses.  Eyes: Conjunctivae are normal. No scleral icterus.  Neck: Normal range of motion. Neck supple. No JVD present. No tracheal deviation present. No thyromegaly present.  Voice is hoarse but not "hot potato" sounding. No  stridor.  Cardiovascular: Normal rate, regular rhythm and normal heart sounds.   Pulmonary/Chest: Effort normal and breath sounds normal. No stridor. No respiratory distress. She has no wheezes. She has no rales. She exhibits no tenderness.  Abdominal: Soft. Bowel sounds are normal. She exhibits no distension and no mass. There is no tenderness. There is no rebound and no guarding.  Musculoskeletal: Normal range of motion.  Lymphadenopathy:    She has no cervical adenopathy.  Neurological: She is alert.  Skin: Skin is warm and dry. Rash noted. She is not diaphoretic. There is erythema.  Bilateral arms, dorsal aspect: diffuse erythematous maculopapular rash. No induration or fluctuance. Non-tender. No involvement of palms of hands or web spaces. No other rashes beyond arms.  Nursing note and vitals reviewed.   ED Course  Procedures (including critical care time) Labs Review Labs Reviewed - No data to display  Imaging Review No results found.   MDM   1. Laryngitis, acute   2. Rash and nonspecific skin eruption     Pt c/o sore throat, dry cough, chest tightness and rash vs sunburn on bilateral arms.  Hx and exam most c/w laryngitis and nondescript rash w/o concern for cellulitis on both arms.  Per Centor Score, pt is low risk for strep, no test performed today.  Will have pt f/u in 3-4 days for recheck of symptoms if not improving. Provided pt education on laryngitis, encouraged vocal rest along with rest and fluids.  Advised pt to use acetaminophen and ibuprofen as needed for fever and pain. Return precautions provided. Pt verbalized understanding and agreement with tx plan.     Noland Fordyce, PA-C 04/23/15 1129

## 2015-04-26 ENCOUNTER — Telehealth: Payer: Self-pay

## 2015-04-26 MED ORDER — HYDROCODONE-HOMATROPINE 5-1.5 MG/5ML PO SYRP: 5.0000 mL | ORAL_SOLUTION | Freq: Every evening | ORAL | Status: DC | PRN

## 2015-04-26 NOTE — Telephone Encounter (Signed)
Script written for hycodan cough syrup but will have to pick up rx since has hydrocodone in it.

## 2015-04-26 NOTE — Telephone Encounter (Signed)
Patient was seen over the weekend for a cough. She was given Tessalon for her cough. The Tessalon is not helping with her cough and it is keeping her up at night. She would like a different cough medication.

## 2015-04-26 NOTE — Telephone Encounter (Signed)
Patient advised.

## 2015-05-02 ENCOUNTER — Emergency Department (INDEPENDENT_AMBULATORY_CARE_PROVIDER_SITE_OTHER)
Admission: EM | Admit: 2015-05-02 | Discharge: 2015-05-02 | Disposition: A | Discharge status: Home / Self Care | Payer: 59 | Source: Home / Self Care | Attending: Family Medicine | Admitting: Family Medicine

## 2015-05-02 ENCOUNTER — Encounter: Payer: Self-pay | Admitting: Emergency Medicine

## 2015-05-02 DIAGNOSIS — J209 Acute bronchitis, unspecified: Secondary | ICD-10-CM

## 2015-05-02 MED ORDER — AZITHROMYCIN 250 MG PO TABS: 250.0000 mg | ORAL_TABLET | Freq: Every day | ORAL | Status: DC

## 2015-05-02 MED ORDER — SALINE SPRAY 0.65 % NA SOLN: 1.0000 | NASAL | Status: DC | PRN

## 2015-05-02 MED ORDER — PREDNISONE 20 MG PO TABS: ORAL_TABLET | ORAL | Status: DC

## 2015-05-02 NOTE — ED Notes (Signed)
Was here 04/23/15 and today for cough that has persisted.

## 2015-05-02 NOTE — Discharge Instructions (Signed)
You may take 400-676m Ibuprofen (Motrin) every 6-8 hours for fever and pain  Alternate with Tylenol  You may take 5086mTylenol every 4-6 hours as needed for fever and pain  Follow-up with your primary care provider next week for recheck of symptoms if not improving.  Be sure to drink plenty of fluids and rest, at least 8hrs of sleep a night, preferably more while you are sick. Return urgent care or go to closest ER if you cannot keep down fluids/signs of dehydration, fever not reducing with Tylenol, difficulty breathing/wheezing, stiff neck, worsening condition, or other concerns (see below)  Be sure to take antibiotic as prescribed and complete the entire course even if he started to feel better to help prevent recurrent infection. You may continue to take cough syrup as prescribed by your primary care provider.  Remember that it does cause drowsiness.  Do not drive or drink alcohol while taking. Acute Bronchitis Bronchitis is inflammation of the airways that extend from the windpipe into the lungs (bronchi). The inflammation often causes mucus to develop. This leads to a cough, which is the most common symptom of bronchitis.  In acute bronchitis, the condition usually develops suddenly and goes away over time, usually in a couple weeks. Smoking, allergies, and asthma can make bronchitis worse. Repeated episodes of bronchitis may cause further lung problems.  CAUSES Acute bronchitis is most often caused by the same virus that causes a cold. The virus can spread from person to person (contagious) through coughing, sneezing, and touching contaminated objects. SIGNS AND SYMPTOMS   Cough.   Fever.   Coughing up mucus.   Body aches.   Chest congestion.   Chills.   Shortness of breath.   Sore throat.  DIAGNOSIS  Acute bronchitis is usually diagnosed through a physical exam. Your health care provider will also ask you questions about your medical history. Tests, such as chest  X-rays, are sometimes done to rule out other conditions.  TREATMENT  Acute bronchitis usually goes away in a couple weeks. Oftentimes, no medical treatment is necessary. Medicines are sometimes given for relief of fever or cough. Antibiotic medicines are usually not needed but may be prescribed in certain situations. In some cases, an inhaler may be recommended to help reduce shortness of breath and control the cough. A cool mist vaporizer may also be used to help thin bronchial secretions and make it easier to clear the chest.  HOME CARE INSTRUCTIONS  Get plenty of rest.   Drink enough fluids to keep your urine clear or pale yellow (unless you have a medical condition that requires fluid restriction). Increasing fluids may help thin your respiratory secretions (sputum) and reduce chest congestion, and it will prevent dehydration.   Take medicines only as directed by your health care provider.  If you were prescribed an antibiotic medicine, finish it all even if you start to feel better.  Avoid smoking and secondhand smoke. Exposure to cigarette smoke or irritating chemicals will make bronchitis worse. If you are a smoker, consider using nicotine gum or skin patches to help control withdrawal symptoms. Quitting smoking will help your lungs heal faster.   Reduce the chances of another bout of acute bronchitis by washing your hands frequently, avoiding people with cold symptoms, and trying not to touch your hands to your mouth, nose, or eyes.   Keep all follow-up visits as directed by your health care provider.  SEEK MEDICAL CARE IF: Your symptoms do not improve after 1 week of treatment.  SEEK IMMEDIATE MEDICAL CARE IF:  You develop an increased fever or chills.   You have chest pain.   You have severe shortness of breath.  You have bloody sputum.   You develop dehydration.  You faint or repeatedly feel like you are going to pass out.  You develop repeated vomiting.  You  develop a severe headache. MAKE SURE YOU:   Understand these instructions.  Will watch your condition.  Will get help right away if you are not doing well or get worse. Document Released: 11/15/2004 Document Revised: 02/22/2014 Document Reviewed: 03/31/2013 [facility] Patient Information 2015 [place], [place]. This information is not intended to replace advice given to you by your health care provider. Make sure you discuss any questions you have with your health care provider.

## 2015-05-02 NOTE — ED Provider Notes (Signed)
CSN: [id number]     Arrival date & time 05/02/15  1233 History   First MD Initiated Contact with Patient 05/02/15 1307     Chief Complaint  Patient presents with  . Cough   (Consider location/radiation/quality/duration/timing/severity/associated sxs/prior Treatment) HPI Patient is a 39 year old female presenting to urgent care with complaint of persistent cough that keeps her up all night despite the use of Hycodan cough syrup called in by her PCP, Dr. Charise Carwin.  Patient has been sick for last week and a half and was seen on 04/23/15 for symptoms consistent with laryngitis.  Patient states her sore throat and voice have improved significantly since then.  Patient was concern with persistent cough and generalized fatigue.  Denies difficulty breathing or chest pain at this time.  No history of asthma.  Denies fevers, chills, nausea, vomiting or diarrhea.  Patient is here with her teenage daughter who is also sick with a sore throat and dry cough.  Patient denies recent travel.  Denies tick bites or rashes.  Past Medical History  Diagnosis Date  . Depression   . Obesity    History reviewed. No pertinent past surgical history. History reviewed. No pertinent family history. History  Substance Use Topics  . Smoking status: Never Smoker   . Smokeless tobacco: Not on file  . Alcohol Use: Not on file   OB History    No data available     Review of Systems  Constitutional: Positive for fatigue. Negative for fever and chills.  HENT: Positive for congestion and sore throat. Negative for trouble swallowing and voice change.   Respiratory: Positive for cough. Negative for shortness of breath.   Cardiovascular: Negative for chest pain and palpitations.  Gastrointestinal: Negative for nausea, vomiting, abdominal pain and diarrhea.  Genitourinary: Negative for dysuria, urgency, hematuria and flank pain.  All other systems reviewed and are negative.   Allergies  Review of patient's allergies  indicates no known allergies.  Home Medications   Prior to Admission medications   Medication Sig Start Date End Date Taking? Authorizing Provider  Alum & Mag Hydroxide-Simeth (MAGIC MOUTHWASH W/LIDOCAINE) SOLN Take 5 mLs by mouth 3 (three) times daily as needed for mouth pain. May gargle and spit or swallow 04/23/15   Noland Fordyce, PA-C  azithromycin (ZITHROMAX) 250 MG tablet Take 1 tablet (250 mg total) by mouth daily. Take first 2 tablets together, then 1 every day until finished. 05/02/15   Noland Fordyce, PA-C  benzonatate (TESSALON) 100 MG capsule Take 1 capsule (100 mg total) by mouth every 8 (eight) hours. 04/23/15   Noland Fordyce, PA-C  HYDROcodone-homatropine (HYCODAN) 5-1.5 MG/5ML syrup Take 5 mLs by mouth at bedtime as needed for cough. 04/26/15   Hali Marry, MD  levonorgestrel (MIRENA) 20 MCG/24HR IUD 1 each by Intrauterine route once.    Historical Provider, MD  omeprazole (PRILOSEC) 40 MG capsule Take 1 capsule (40 mg total) by mouth daily. 08/19/14   Hali Marry, MD  predniSONE (DELTASONE) 20 MG tablet 3 tabs po day one, then 2 po daily x 4 days 05/02/15   Noland Fordyce, PA-C  sodium chloride (OCEAN) 0.65 % SOLN nasal spray Place 1 spray into both nostrils as needed for congestion. 05/02/15   Noland Fordyce, PA-C   BP 113/72 mmHg  Pulse 79  Temp(Src) 98.4 F (36.9 C) (Oral)  Resp 16  Ht 5' 8"  (1.727 m)  Wt 188 lb (85.276 kg)  BMI 28.59 kg/m2  SpO2 98% Physical Exam  Constitutional: She  appears well-developed and well-nourished. No distress.  HENT:  Head: Normocephalic and atraumatic.  Right Ear: Hearing, tympanic membrane, external ear and ear canal normal.  Left Ear: Hearing, tympanic membrane, external ear and ear canal normal.  Nose: Nose normal.  Mouth/Throat: Uvula is midline, oropharynx is clear and moist and mucous membranes are normal.  Eyes: Conjunctivae are normal. No scleral icterus.  Neck: Normal range of motion.  Cardiovascular: Normal rate,  regular rhythm and normal heart sounds.   Pulmonary/Chest: Effort normal and breath sounds normal. No stridor. No respiratory distress. She has no wheezes. She has no rales. She exhibits no tenderness.  Mild intermittent dry cough. No respiratory distress. No rhonchi.  Abdominal: Soft. Bowel sounds are normal. She exhibits no distension and no mass. There is no tenderness. There is no rebound and no guarding.  Musculoskeletal: Normal range of motion.  Lymphadenopathy:    She has no cervical adenopathy.  Neurological: She is alert.  Skin: Skin is warm and dry. She is not diaphoretic.  Nursing note and vitals reviewed.   ED Course  Procedures (including critical care time) Labs Review Labs Reviewed - No data to display  Imaging Review No results found.   MDM   1. Acute bronchitis, unspecified organism     Patient is a 39 year old female presenting to urgent care with persistent cough that keeps her up at night.  Patient was seen 9 days ago, diagnosed with laryngitis.  Voice has improved significantly.  Lungs are clear.  Patient is afebrile.  No rhonchi.  No respiratory distress.  No concern for pneumonia.  However, due to persistent cough.  Will start patient on prednisone taper as well as azithromycin for acute bronchitis.  Home care instructions provided including encouraged patient to drink plenty of fluids and get plenty of rest. Advised to f/u with PCP in 1 week for recheck of symptoms if not improving. Return precautions provided. Pt verbalized understanding and agreement with tx plan.     Noland Fordyce, PA-C 05/02/15 [phone]

## 2015-05-09 ENCOUNTER — Ambulatory Visit (INDEPENDENT_AMBULATORY_CARE_PROVIDER_SITE_OTHER): Payer: 59 | Admitting: Family Medicine

## 2015-05-09 ENCOUNTER — Encounter: Payer: Self-pay | Admitting: Family Medicine

## 2015-05-09 VITALS — BP 131/79 | HR 87 | Temp 98.4°F | Ht 68.0 in | Wt 186.0 lb

## 2015-05-09 DIAGNOSIS — Z2089 Contact with and (suspected) exposure to other communicable diseases: Secondary | ICD-10-CM | POA: Diagnosis not present

## 2015-05-09 DIAGNOSIS — J069 Acute upper respiratory infection, unspecified: Secondary | ICD-10-CM | POA: Diagnosis not present

## 2015-05-09 DIAGNOSIS — J029 Acute pharyngitis, unspecified: Secondary | ICD-10-CM | POA: Diagnosis not present

## 2015-05-09 DIAGNOSIS — H6123 Impacted cerumen, bilateral: Secondary | ICD-10-CM | POA: Diagnosis not present

## 2015-05-09 DIAGNOSIS — Z20818 Contact with and (suspected) exposure to other bacterial communicable diseases: Secondary | ICD-10-CM

## 2015-05-09 LAB — POCT RAPID STREP A (OFFICE): RAPID STREP A SCREEN: NEGATIVE

## 2015-05-09 MED ORDER — HYDROCODONE-HOMATROPINE 5-1.5 MG/5ML PO SYRP: 5.0000 mL | ORAL_SOLUTION | Freq: Every evening | ORAL | Status: DC | PRN

## 2015-05-09 MED ORDER — AMOXICILLIN-POT CLAVULANATE 875-125 MG PO TABS: 1.0000 | ORAL_TABLET | Freq: Two times a day (BID) | ORAL | Status: DC

## 2015-05-09 NOTE — Progress Notes (Signed)
   Subjective:    Patient ID: Rhonda Stein, female    DOB: 05-Jan-1976, 39 y.o.   MRN: [id number]  HPI patient comes in today complaining of laryngitis. She went to [facility] on June 27 of July 1. She began to lose her voice on June 30. By July 2 she decided to go to urgent care and was diagnosed with laryngitis. A week later she returned GERD can care as her voice was no better. She has now been on 2 rounds of antibiotic as well as prednisone and Tessalon Perles and is still coughing. She also got a prescription cough medicine with codeine. She completed her antibiotic 3 days ago on July 15. She now complains of some pressure behind her ears as well as some sore throat. She was exposed to strep throat through her daughter.  Chest feels sore and feels like the razor blades. Says thought was getting better until this morning.   Lots of pressure in her head, feels like it is doing to "blow up" on her.    Review of Systems     Objective:   Physical Exam  Constitutional: She is oriented to person, place, and time. She appears well-developed and well-nourished.  HENT:  Head: Normocephalic and atraumatic.  Right Ear: External ear normal.  Left Ear: External ear normal.  Nose: Nose normal.  Mouth/Throat: Oropharynx is clear and moist.  TMs and canals are clear.   Eyes: Conjunctivae and EOM are normal. Pupils are equal, round, and reactive to light.  Neck: Neck supple. No thyromegaly present.  Cardiovascular: Normal rate, regular rhythm and normal heart sounds.   Pulmonary/Chest: Effort normal and breath sounds normal. She has no wheezes.  Lymphadenopathy:    She has no cervical adenopathy.  Neurological: She is alert and oriented to person, place, and time.  Skin: Skin is warm and dry.  Psychiatric: She has a normal mood and affect.          Assessment & Plan:  URI - ST - will test for strep with Pos contact at home. Test was negative. Likley viral.  Gave reassurance. Did go ahead and  refill the [place] code on cough medicine. If she suddenly feels worse or just not better by the end of the week than okay to fill the antibiotic for Augmentin. Certainly if her throat becomes more sore painful or she runs a fever then encouraged her to call the office back.  Bilateral cerumen impaction-ears were irrigated today. I also manually removed some from the right ear canal after irrigation.

## 2015-06-23 ENCOUNTER — Telehealth: Payer: Self-pay | Admitting: Family Medicine

## 2015-06-23 ENCOUNTER — Ambulatory Visit (INDEPENDENT_AMBULATORY_CARE_PROVIDER_SITE_OTHER): Payer: 59 | Admitting: Family Medicine

## 2015-06-23 ENCOUNTER — Encounter: Payer: Self-pay | Admitting: Family Medicine

## 2015-06-23 VITALS — BP 126/84 | HR 76 | Ht 68.0 in | Wt 194.0 lb

## 2015-06-23 VITALS — BP 126/84 | HR 76 | Wt 194.0 lb

## 2015-06-23 DIAGNOSIS — S338XXA Sprain of other parts of lumbar spine and pelvis, initial encounter: Secondary | ICD-10-CM | POA: Diagnosis not present

## 2015-06-23 DIAGNOSIS — S39012A Strain of muscle, fascia and tendon of lower back, initial encounter: Secondary | ICD-10-CM

## 2015-06-23 DIAGNOSIS — S161XXA Strain of muscle, fascia and tendon at neck level, initial encounter: Secondary | ICD-10-CM | POA: Diagnosis not present

## 2015-06-23 DIAGNOSIS — F329 Major depressive disorder, single episode, unspecified: Secondary | ICD-10-CM

## 2015-06-23 DIAGNOSIS — F32A Depression, unspecified: Secondary | ICD-10-CM

## 2015-06-23 MED ORDER — CYCLOBENZAPRINE HCL 5 MG PO TABS: 5.0000 mg | ORAL_TABLET | Freq: Three times a day (TID) | ORAL | Status: DC | PRN

## 2015-06-23 MED ORDER — NAPROXEN 500 MG PO TABS: 500.0000 mg | ORAL_TABLET | Freq: Two times a day (BID) | ORAL | Status: DC

## 2015-06-23 MED ORDER — VENLAFAXINE HCL ER 37.5 MG PO CP24: 37.5000 mg | ORAL_CAPSULE | Freq: Every day | ORAL | Status: DC
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
## 2015-06-23 NOTE — Assessment & Plan Note (Signed)
Depression and anxiety. Trial of counseling and Effexor XR. Recheck in 2 weeks.

## 2015-06-23 NOTE — Patient Instructions (Addendum)
Thank you for coming in today. Come back or go to the emergency room if you notice new weakness new numbness problems walking or bowel or bladder problems. Take naproxen twice daily for pain.  Flexeril mostly at night but up to 3 times a day as needed. This medicine will make you sleepy so do not drive after taking it. Call me if you need PT>  Return in 2 weeks.

## 2015-06-23 NOTE — Telephone Encounter (Signed)
Rx Effexor as per other note.

## 2015-06-24 ENCOUNTER — Ambulatory Visit: Payer: 59 | Admitting: Family Medicine

## 2015-07-07 ENCOUNTER — Ambulatory Visit: Payer: 59 | Admitting: Family Medicine

## 2015-07-13 ENCOUNTER — Encounter: Payer: Self-pay | Admitting: Family Medicine

## 2015-07-13 ENCOUNTER — Ambulatory Visit (INDEPENDENT_AMBULATORY_CARE_PROVIDER_SITE_OTHER): Payer: 59 | Admitting: Family Medicine

## 2015-07-13 VITALS — BP 116/85 | HR 72 | Ht 68.0 in | Wt 192.0 lb

## 2015-07-13 DIAGNOSIS — F329 Major depressive disorder, single episode, unspecified: Secondary | ICD-10-CM

## 2015-07-13 DIAGNOSIS — L719 Rosacea, unspecified: Secondary | ICD-10-CM | POA: Diagnosis not present

## 2015-07-13 DIAGNOSIS — F32A Depression, unspecified: Secondary | ICD-10-CM

## 2015-07-13 MED ORDER — VENLAFAXINE HCL ER 37.5 MG PO CP24: 37.5000 mg | ORAL_CAPSULE | Freq: Every day | ORAL | Status: DC

## 2015-07-13 MED ORDER — METRONIDAZOLE 1 % EX GEL: Freq: Every day | CUTANEOUS | Status: DC

## 2015-07-13 NOTE — Patient Instructions (Signed)
Thank you for coming in today. Take effexor xr daily  Use metrogel on the face daily.  Return in 3 months.   Return sooner if worsening.   Rosacea Rosacea is a long-term (chronic) condition that affects the skin of the face (cheeks, nose, brow, and chin) and sometimes the eyes. Rosacea causes the blood vessels near the surface of the skin to enlarge, resulting in redness. This condition usually begins after age 41. It occurs most often in light-skinned women. Without treatment, rosacea tends to get worse over time. There is no cure for rosacea, but treatment can help control your symptoms. CAUSES  The cause is unknown. It is thought that some people may inherit a tendency to develop rosacea. Certain triggers can make your rosacea worse, including:  Hot baths.  Exercise.  Sunlight.  Very hot or cold temperatures.  Hot or spicy foods and drinks.  Drinking alcohol.  Stress.  Taking blood pressure medicine.  Long-term use of topical steroids on the face. SYMPTOMS   Redness of the face.  Red bumps or pimples on the face.  Red, enlarged nose (rhinophyma).  Blushing easily.  Red lines on the skin.  Irritated or burning feeling in the eyes.  Swollen eyelids. DIAGNOSIS  Your caregiver can usually tell what is wrong by asking about your symptoms and performing a physical exam. TREATMENT  Avoiding triggers is an important part of treatment. You will also need to see a skin specialist (dermatologist) who can develop a treatment plan for you. The goals of treatment are to control your condition and to improve the appearance of your skin. It may take several weeks or months of treatment before you notice an improvement in your skin. Even after your skin improves, you will likely need to continue treatment to prevent your rosacea from coming back. Treatment methods may include:  Using sunscreen or sunblock daily to protect the skin.  Antibiotic medicine, such as metronidazole,  applied directly to the skin.  Antibiotics taken by mouth. This is usually prescribed if you have eye problems from your rosacea.  Laser surgery to improve the appearance of the skin. This surgery can reduce the appearance of red lines on the skin and can remove excess tissue from the nose to reduce its size. HOME CARE INSTRUCTIONS  Avoid things that seem to trigger your flare-ups.  If you are given antibiotics, take them as directed. Finish them even if you start to feel better.  Use a gentle facial cleanser that does not contain alcohol.  You may use a mild facial moisturizer.  Use a sunscreen or sunblock with SPF 30 or greater.  Wear a green-tinted foundation powder to conceal redness, if needed. Choose cosmetics that are noncomedogenic. This means they do not block your pores.  If your eyelids are affected, apply warm compresses to the eyelids. Do this up to 4 times a day or as directed by your caregiver. SEEK MEDICAL CARE IF:  Your skin problems get worse.  You feel depressed.  You lose your appetite.  You have trouble concentrating.  You have problems with your eyes, such as redness or itching. MAKE SURE YOU:  Understand these instructions.  Will watch your condition.  Will get help right away if you are not doing well or get worse. Document Released: 11/15/2004 Document Revised: 04/08/2012 Document Reviewed: 09/18/2011 [facility] Patient Information 2015 [place], [place]. This information is not intended to replace advice given to you by your health care provider. Make sure you discuss any questions  you have with your health care provider.

## 2015-07-13 NOTE — Assessment & Plan Note (Signed)
Doing much better. Continue Effexor and counseling. Recheck in 3 months
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
## 2015-07-13 NOTE — Assessment & Plan Note (Signed)
Believe the rash is predominantly from rosacea. I'm doubtful for drug reaction. Plan for watchful waiting with metronidazole gel. Recheck 3 months.

## 2015-10-12 ENCOUNTER — Encounter: Payer: Self-pay | Admitting: Family Medicine

## 2015-10-12 ENCOUNTER — Ambulatory Visit (INDEPENDENT_AMBULATORY_CARE_PROVIDER_SITE_OTHER): Payer: 59 | Admitting: Family Medicine

## 2015-10-12 VITALS — BP 118/77 | HR 84 | Wt 199.0 lb

## 2015-10-12 DIAGNOSIS — L700 Acne vulgaris: Secondary | ICD-10-CM | POA: Diagnosis not present

## 2015-10-12 DIAGNOSIS — Z6830 Body mass index (BMI) 30.0-30.9, adult: Secondary | ICD-10-CM | POA: Diagnosis not present

## 2015-10-12 DIAGNOSIS — R635 Abnormal weight gain: Secondary | ICD-10-CM | POA: Diagnosis not present

## 2015-10-12 DIAGNOSIS — F329 Major depressive disorder, single episode, unspecified: Secondary | ICD-10-CM

## 2015-10-12 DIAGNOSIS — F32A Depression, unspecified: Secondary | ICD-10-CM

## 2015-10-12 MED ORDER — LORCASERIN HCL 10 MG PO TABS: 10.0000 mg | ORAL_TABLET | Freq: Two times a day (BID) | ORAL | Status: DC

## 2015-10-12 MED ORDER — VENLAFAXINE HCL ER 37.5 MG PO CP24: 37.5000 mg | ORAL_CAPSULE | Freq: Every day | ORAL | Status: DC

## 2015-10-12 NOTE — Progress Notes (Signed)
   Subjective:    Patient ID: Rhonda Stein, female    DOB: Nov 23, 1975, 39 y.o.   MRN: [id number]  HPI Depression - she is doing well. Says she is the best on this medication. No at a tearful. Can let things go more easily. Sleep is ok but still relying on melatonin.  Says if she forgets it she will get a "zinging" feeling. belvi  Acne - she's had acne since she was a teenager and has continued into adulthood. She gets a lot of blackheads but also gets more raised papular type bumps and pustules. She's tried many things oer the yearsand has been 100s of dollars on over-the-conter proucts etc. She has seen providers for this before. At one int theymentioned Accutane when she wassure she was done having ildren. She is at tat oint now where [employer] o consider it. She also more recently tried metronidazole gel which was not effective.  She would also like to restart phentermine. She's taken it previously without any problems or side effects. She really wants to lose about 20 pounds for the new year and get back into healthy regimen with exercise and diet. She would like to start a prescription again or discuss other weight loss medications. She said she really just wants something to help curb her appetite.  Review of Systems     Objective:   Physical Exam  Constitutional: She is oriented to person, place, and time. She appears well-developed and well-nourished.  HENT:  Head: Normocephalic and atraumatic.  Cardiovascular: Normal rate, regular rhythm and normal heart sounds.   Pulmonary/Chest: Effort normal and breath sounds normal.  Musculoskeletal:  She has a few scattered papules over her chin and facial cheeks. She also has some scarring over her chin from previous lesions.  Neurological: She is alert and oriented to person, place, and time.  Skin: Skin is warm and dry.  Psychiatric: She has a normal mood and affect. Her behavior is normal.          Assessment & Plan:  Depression  - PHQ-9 score of 3 and GAD- 7 score of 3.  Well controlled. F/U in 3 months. She's doing absolutely fantastic. Continue current regimen.  Acne-discussed several choices. We could try topical versus an oral antibiotic versus referral for possible Accutane. We'll place referral to a provider who is certified to prescribe Accutane.  Abnormal weight gain.BMI 30.  We discussed non-stimulant options today. Recommend a trial of Belviq. It looks like it may be covered by her insurance. She will need follow-up in 6 weeks for blood pressure and weight check. She's been on several rounds of phentermine so I hesitate to put her on another round of the medication since clearly the results are somewhat short-lived. We discussed the Belviq has also more long-term data as far safety concerns.

## 2016-04-09 ENCOUNTER — Other Ambulatory Visit: Payer: Self-pay | Admitting: Family Medicine

## 2016-07-18 ENCOUNTER — Other Ambulatory Visit: Payer: Self-pay | Admitting: Family Medicine

## 2016-07-31 ENCOUNTER — Ambulatory Visit (INDEPENDENT_AMBULATORY_CARE_PROVIDER_SITE_OTHER): Payer: Self-pay | Admitting: Family Medicine

## 2016-07-31 ENCOUNTER — Encounter: Payer: Self-pay | Admitting: Family Medicine

## 2016-07-31 VITALS — BP 119/78 | HR 77 | Ht 68.0 in | Wt 173.0 lb

## 2016-07-31 DIAGNOSIS — R05 Cough: Secondary | ICD-10-CM

## 2016-07-31 DIAGNOSIS — J019 Acute sinusitis, unspecified: Secondary | ICD-10-CM

## 2016-07-31 DIAGNOSIS — R059 Cough, unspecified: Secondary | ICD-10-CM

## 2016-07-31 MED ORDER — AMOXICILLIN-POT CLAVULANATE 875-125 MG PO TABS: 1.0000 | ORAL_TABLET | Freq: Two times a day (BID) | ORAL | 0 refills | Status: DC

## 2016-07-31 MED ORDER — VENLAFAXINE HCL ER 37.5 MG PO CP24: 37.5000 mg | ORAL_CAPSULE | Freq: Every day | ORAL | 1 refills | Status: DC

## 2016-07-31 MED ORDER — BENZONATATE 200 MG PO CAPS: 200.0000 mg | ORAL_CAPSULE | Freq: Two times a day (BID) | ORAL | 0 refills | Status: DC | PRN

## 2016-07-31 NOTE — Patient Instructions (Signed)
Call if not better in one week.

## 2016-07-31 NOTE — Progress Notes (Signed)
   Subjective:    Patient ID: Rhonda Stein, female    DOB: 1976-09-13, 40 y.o.   MRN: [id number]  HPI Cough sxs x 6wks she reports that her sxs began w/sinus drainage 4 wks ago she has been using dayquil and nyquil this has not helped her at all. her phlegm has been clear. denies any fevers,chills,facial pain.No fevers or chills. No headaches or facial pain. No shortness of breath.   Review of Systems     Objective:   Physical Exam  Constitutional: She is oriented to person, place, and time. She appears well-developed and well-nourished.  HENT:  Head: Normocephalic and atraumatic.  Right Ear: External ear normal.  Left Ear: External ear normal.  Nose: Nose normal.  Mouth/Throat: Oropharynx is clear and moist.  Bilat canals blocked by cerumen.  Eyes: Conjunctivae and EOM are normal. Pupils are equal, round, and reactive to light.  Neck: Neck supple. No thyromegaly present.  Cardiovascular: Normal rate, regular rhythm and normal heart sounds.   Pulmonary/Chest: Effort normal and breath sounds normal. She has no wheezes.  Lymphadenopathy:    She has no cervical adenopathy.  Neurological: She is alert and oriented to person, place, and time.  Skin: Skin is warm and dry.  Psychiatric: She has a normal mood and affect.       Assessment & Plan:  Likely acute sinusitis with excess postnasal drip triggering her cough, as her chest is actually clear today. Will treat with Augmentin. If not better in one week then consider an allergic component to the post nasal drip. tessalone perles for cough.

## 2016-08-15 ENCOUNTER — Telehealth: Payer: Self-pay

## 2016-08-15 NOTE — Telephone Encounter (Signed)
You seen pt on 07/31/16 for cough and she was Rx some augmentin. She reports that the isn't any better. Should she be seen again or could something be sent to pharmacy. Please advise.

## 2016-08-15 NOTE — Telephone Encounter (Signed)
Pt.notified

## 2016-08-15 NOTE — Telephone Encounter (Signed)
See if she has tried anything for allergies. If not she could certainly consider that at least for the next 5 or 6 days. Otherwise I think she probably does need to come back to be reexamined if she's not better.

## 2016-10-21 ENCOUNTER — Encounter: Payer: Self-pay | Admitting: Emergency Medicine

## 2016-10-21 ENCOUNTER — Emergency Department (INDEPENDENT_AMBULATORY_CARE_PROVIDER_SITE_OTHER): Payer: Self-pay

## 2016-10-21 ENCOUNTER — Emergency Department (INDEPENDENT_AMBULATORY_CARE_PROVIDER_SITE_OTHER)
Admission: EM | Admit: 2016-10-21 | Discharge: 2016-10-21 | Disposition: A | Discharge status: Home / Self Care | Payer: 59 | Source: Home / Self Care | Attending: Family Medicine | Admitting: Family Medicine

## 2016-10-21 DIAGNOSIS — K219 Gastro-esophageal reflux disease without esophagitis: Secondary | ICD-10-CM

## 2016-10-21 DIAGNOSIS — R05 Cough: Secondary | ICD-10-CM

## 2016-10-21 DIAGNOSIS — R053 Chronic cough: Secondary | ICD-10-CM

## 2016-10-21 DIAGNOSIS — H6123 Impacted cerumen, bilateral: Secondary | ICD-10-CM

## 2016-10-21 DIAGNOSIS — R0981 Nasal congestion: Secondary | ICD-10-CM

## 2016-10-21 MED ORDER — LEVOCETIRIZINE DIHYDROCHLORIDE 5 MG PO TABS: 5.0000 mg | ORAL_TABLET | Freq: Every evening | ORAL | 1 refills | Status: DC

## 2016-10-21 MED ORDER — BENZONATATE 100 MG PO CAPS: 100.0000 mg | ORAL_CAPSULE | Freq: Three times a day (TID) | ORAL | 0 refills | Status: DC

## 2016-10-21 MED ORDER — OMEPRAZOLE 20 MG PO CPDR: 20.0000 mg | DELAYED_RELEASE_CAPSULE | Freq: Two times a day (BID) | ORAL | 0 refills | Status: DC

## 2016-10-21 MED ORDER — IPRATROPIUM BROMIDE 0.06 % NA SOLN: 2.0000 | Freq: Four times a day (QID) | NASAL | 1 refills | Status: DC

## 2016-10-21 MED ORDER — AZITHROMYCIN 250 MG PO TABS: 250.0000 mg | ORAL_TABLET | Freq: Every day | ORAL | 0 refills | Status: DC

## 2016-10-21 NOTE — ED Provider Notes (Signed)
CSN: [id number]     Arrival date & time 10/21/16  1104 History   First MD Initiated Contact with Patient 10/21/16 1116     Chief Complaint  Patient presents with  . Cough  . Nasal Congestion   (Consider location/radiation/quality/duration/timing/severity/associated sxs/prior Treatment) HPI Rhonda Stein is a 40 y.o. female presenting to UC with c/o intermittent dry hacking cough for about 4 months.  She was seen by her PCP initially and prescribed an antibiotic, although was advised it would likely not help as cough was likely due to allergies.  Cough has persisted despite the initial antibiotic and OTC allergy medication.  This past 2-3 days she has developed nasal congestion, scratchy throat and chest. She also notes she has had intermittent acid reflux but has never been prescribed prescription strength antiacid medications.  Denies fever, chills, n/v/d but notes she does gag at times when coughing becomes severe, usually worse at night.    Past Medical History:  Diagnosis Date  . Depression   . Obesity    History reviewed. No pertinent surgical history. History reviewed. No pertinent family history. Social History  Substance Use Topics  . Smoking status: Never Smoker  . Smokeless tobacco: Never Used  . Alcohol use Yes   OB History    No data available     Review of Systems  Constitutional: Negative for chills and fever.  HENT: Positive for congestion, postnasal drip and rhinorrhea. Negative for ear pain, sore throat, trouble swallowing and voice change.   Respiratory: Positive for cough. Negative for shortness of breath.   Cardiovascular: Negative for chest pain and palpitations.  Gastrointestinal: Negative for abdominal pain, diarrhea, nausea and vomiting.  Musculoskeletal: Negative for arthralgias, back pain and myalgias.  Skin: Negative for rash.    Allergies  Patient has no known allergies.  Home Medications   Prior to Admission medications   Medication Sig Start  Date End Date Taking? Authorizing Provider  amoxicillin-clavulanate (AUGMENTIN) 875-125 MG tablet Take 1 tablet by mouth 2 (two) times daily. 07/31/16   Hali Marry, MD  azithromycin (ZITHROMAX) 250 MG tablet Take 1 tablet (250 mg total) by mouth daily. Take first 2 tablets together, then 1 every day until finished. 10/21/16   Noland Fordyce, PA-C  benzonatate (TESSALON) 100 MG capsule Take 1-2 capsules (100-200 mg total) by mouth every 8 (eight) hours. 10/21/16   Noland Fordyce, PA-C  benzonatate (TESSALON) 200 MG capsule Take 1 capsule (200 mg total) by mouth 2 (two) times daily as needed for cough. 07/31/16   Hali Marry, MD  ipratropium (ATROVENT) 0.06 % nasal spray Place 2 sprays into both nostrils 4 (four) times daily. For at least 1 week 10/21/16   Noland Fordyce, PA-C  levocetirizine (XYZAL) 5 MG tablet Take 1 tablet (5 mg total) by mouth every evening. 10/21/16   Noland Fordyce, PA-C  levonorgestrel (MIRENA) 20 MCG/24HR IUD 1 each by Intrauterine route once.    Historical Provider, MD  omeprazole (PRILOSEC) 20 MG capsule Take 1 capsule (20 mg total) by mouth 2 (two) times daily before a meal. 10/21/16   Noland Fordyce, PA-C  venlafaxine XR (EFFEXOR-XR) 37.5 MG 24 hr capsule Take 1 capsule (37.5 mg total) by mouth daily. 07/31/16   Hali Marry, MD   Meds Ordered and Administered this Visit  Medications - No data to display  BP 118/72 (BP Location: Left Arm)   Pulse 78   Temp 98.2 F (36.8 C) (Oral)   Resp 16   Ht  5' 8"  (1.727 m)   Wt 170 lb (77.1 kg)   LMP 09/30/2016   SpO2 98%   BMI 25.85 kg/m  No data found.   Physical Exam  Constitutional: She appears well-developed and well-nourished. No distress.  HENT:  Head: Normocephalic and atraumatic.  Nose: Mucosal edema present.  Mouth/Throat: Uvula is midline and mucous membranes are normal. Posterior oropharyngeal erythema present. No oropharyngeal exudate, posterior oropharyngeal edema or tonsillar  abscesses.  Bilateral cerumen impaction. TMs: normal after cerumen removal  Eyes: Conjunctivae are normal. No scleral icterus.  Neck: Normal range of motion. Neck supple.  Cardiovascular: Normal rate, regular rhythm and normal heart sounds.   Pulmonary/Chest: Effort normal and breath sounds normal. No stridor. No respiratory distress. She has no wheezes. She has no rales.  Musculoskeletal: Normal range of motion.  Lymphadenopathy:    She has no cervical adenopathy.  Neurological: She is alert.  Skin: Skin is warm and dry. She is not diaphoretic.  Nursing note and vitals reviewed.   Urgent Care Course   Clinical Course     Procedures (including critical care time)  Labs Review Labs Reviewed - No data to display  Imaging Review Dg Chest 2 View  Result Date: 10/21/2016 CLINICAL DATA:  Pt states she has had a progressively worsening cough x 4 months. Denies any other symptoms. Social smoker. EXAM: CHEST  2 VIEW COMPARISON:  None. FINDINGS: The heart size and mediastinal contours are within normal limits. Both lungs are clear. No pleural effusion or pneumothorax. The visualized skeletal structures are unremarkable. IMPRESSION: No active cardiopulmonary disease. Electronically Signed   By: Lajean Manes M.D.   On: 10/21/2016 11:29    MDM   1. Chronic cough   2. Nasal congestion   3. Bilateral impacted cerumen   4. Gastroesophageal reflux disease, esophagitis presence not specified    Pt c/o persistent cough for 4 months but now developing sinus congestion with the cough.  Chronic cough possibly due to acid reflux and/or allergies/post-nasal drip as cough is worse when she lies down.  Nasal congestion like viral infection at this time. Encouraged symptomatic treatment fist. May start taking azithromycin if symptoms not improving.   F/u with PCP in 1-2 weeks for recheck of symptoms.       Noland Fordyce, PA-C 10/21/16 1228

## 2016-10-21 NOTE — Discharge Instructions (Signed)
°  For the Ipratropium nasal spray, be sure to use as prescribed to help prevent post-nasal drip, which can trigger coughing, especially at night.  Use 2 sprays per nostril 4 times daily while sick.  You should spray one time in each nostril pointing the spray to the out portion of your nostril, breath in slowly while spraying. Wait about 30 seconds to 1 minute before given the second spray in each nostril.  This will ensure you get the most benefit from each spray.  If the nasal spray dries out your nose or sinuses too much, you can decrease frequency of use to 1-2 times daily for 1 week.  Your symptoms are likely due to a virus such as the common cold, however, if you developing worsening chest congestion with shortness of breath, persistent fever for 3 days, or symptoms not improving in 4-5 days, you may fill the antibiotic (azithromycin).  If you do fill the antibiotic,  please take antibiotics as prescribed and be sure to complete entire course even if you start to feel better to ensure infection does not come back.

## 2016-10-21 NOTE — ED Triage Notes (Signed)
Patient reports 4 months of coughing; worsened 2 days ago with addition of congestion.

## 2016-10-31 ENCOUNTER — Ambulatory Visit: Payer: 59 | Admitting: Family Medicine

## 2016-11-01 ENCOUNTER — Encounter: Payer: Self-pay | Admitting: Family Medicine

## 2016-11-01 ENCOUNTER — Ambulatory Visit (INDEPENDENT_AMBULATORY_CARE_PROVIDER_SITE_OTHER): Payer: Self-pay | Admitting: Family Medicine

## 2016-11-01 DIAGNOSIS — R05 Cough: Secondary | ICD-10-CM

## 2016-11-01 DIAGNOSIS — R053 Chronic cough: Secondary | ICD-10-CM | POA: Insufficient documentation

## 2016-11-01 MED ORDER — OMEPRAZOLE 40 MG PO CPDR: 40.0000 mg | DELAYED_RELEASE_CAPSULE | Freq: Every day | ORAL | 3 refills | Status: DC

## 2016-11-01 MED ORDER — GUAIFENESIN-CODEINE 100-10 MG/5ML PO SOLN: 5.0000 mL | Freq: Every evening | ORAL | 0 refills | Status: DC | PRN

## 2016-11-01 MED ORDER — VENLAFAXINE HCL 25 MG PO TABS: ORAL_TABLET | ORAL | 0 refills | Status: DC

## 2016-11-01 MED ORDER — FLUTICASONE PROPIONATE 50 MCG/ACT NA SUSP: 2.0000 | Freq: Every day | NASAL | 2 refills | Status: DC

## 2016-11-01 NOTE — Progress Notes (Signed)
Rhonda Stein (Rhonda Stein) is a 41 y.o. female who presents to [facility] today for cough and rib pain.  Cough: dry, since August, associated with substernal/epigastric burning improved with antacids. She also started smoking 1-2 cigarettes daily around that time, but quit Jan 1. She saw Dr. Madilyn Fireman for sinusitis and cough last October, when she was treated with Augmentin and tessalon. Cough persisted, and she presented to urgent care 1 week ago, where a CXR was normal and she was given azithromycin and omeprazole. She reports some improvement with azithromycin. She has been taking omeprazole for the past 3 days, but only after meals because she forgets to take it before.  Rib pain: She was carrying heavy book last week. The next day, she noticed muscle pain around her right ribs that worsens with coughing and abdominal movement.   Past Medical History:  Diagnosis Date  . Depression   . Obesity    No past surgical history on file. Social History  Substance Use Topics  . Smoking status: Never Smoker  . Smokeless tobacco: Never Used  . Alcohol use Yes   family history is not on file.  ROS as above:  Medications: Current Outpatient Prescriptions  Medication Sig Dispense Refill  . fluticasone (FLONASE) 50 MCG/ACT nasal spray Place 2 sprays into both nostrils daily. 16 g 2  . guaiFENesin-codeine 100-10 MG/5ML syrup Take 5 mLs by mouth at bedtime as needed for cough. 120 mL 0  . levocetirizine (XYZAL) 5 MG tablet Take 1 tablet (5 mg total) by mouth every evening. 30 tablet 1  . levonorgestrel (MIRENA) 20 MCG/24HR IUD 1 each by Intrauterine route once.    Marland Kitchen omeprazole (PRILOSEC) 40 MG capsule Take 1 capsule (40 mg total) by mouth daily. 30 capsule 3  . venlafaxine (EFFEXOR) 25 MG tablet Take 1/2 tablet twice daily for 2 weeks then take 1/4 tablet twice daily for 2 weeks  then take 1/4 tablet daily for 2 weeks then stop. 60 tablet 0  . venlafaxine XR (EFFEXOR-XR) 37.5 MG 24 hr capsule Take 1 capsule (37.5 mg total) by mouth daily. 90 capsule 1   No current facility-administered medications for this visit.    No Known Allergies  Health Maintenance Health Maintenance  Topic Date Due  . HIV Screening  10/20/1989  . PAP SMEAR  04/22/2016  . INFLUENZA VACCINE  05/22/2016  . TETANUS/TDAP  03/14/2022     Exam:  BP 126/86   Pulse 73   Wt 186 lb (84.4 kg)   LMP 09/30/2016   BMI 28.28 kg/m  Gen: Well NAD HEENT: EOMI,  MMM, posterior oropharynx with mild cobblestoning Lungs: Normal work of breathing. CTABL Heart: RRR no MRG Abd: NABS, Soft. Nondistended, Nontender Exts: Brisk capillary refill, warm and well perfused.  MSK: Tender to palpation along right anterior 5th intercostal space   Chest x-ray 10/21/16: IMPRESSION: No active cardiopulmonary disease.  No results found for this or any previous visit (from the past 72 hour(s)). No results found.    Assessment and Plan: 41 y.o. female with chronic cough and rib pain.  Chronic cough: Likely GERD given associated substernal/epigastric burning. Allergies/asthma may be contributing. - Omeprazole daily - Flonase   Rib pain: Likely muscle strain.  - NSAIDs,  - Codeine cough syrup at night   No orders of the defined types were placed in this encounter.   Discussed warning signs or symptoms. Please see discharge instructions. Patient expresses understanding.

## 2016-11-01 NOTE — Patient Instructions (Signed)
Thank you for coming in today. Use the omeprazole daily.  Use the nasal spray.  Use codeine cough medicine sparingly.  Call or go to the emergency room if you get worse, have trouble breathing, have chest pains, or palpitations.  If not better in 1 month follow up with Dr Madilyn Fireman or myself and schedule for spirometry.     Cough, Adult Coughing is a reflex that clears your throat and your airways. Coughing helps to heal and protect your lungs. It is normal to cough occasionally, but a cough that happens with other symptoms or lasts a long time may be a sign of a condition that needs treatment. A cough may last only 2-3 weeks (acute), or it may last longer than 8 weeks (chronic). What are the causes? Coughing is commonly caused by:  Breathing in substances that irritate your lungs.  A viral or bacterial respiratory infection.  Allergies.  Asthma.  Postnasal drip.  Smoking.  Acid backing up from the stomach into the esophagus (gastroesophageal reflux).  Certain medicines.  Chronic lung problems, including COPD (or rarely, lung cancer).  Other medical conditions such as heart failure. Follow these instructions at home: Pay attention to any changes in your symptoms. Take these actions to help with your discomfort:  Take medicines only as told by your health care provider.  If you were prescribed an antibiotic medicine, take it as told by your health care provider. Do not stop taking the antibiotic even if you start to feel better.  Talk with your health care provider before you take a cough suppressant medicine.  Drink enough fluid to keep your urine clear or pale yellow.  If the air is dry, use a cold steam vaporizer or humidifier in your bedroom or your home to help loosen secretions.  Avoid anything that causes you to cough at work or at home.  If your cough is worse at night, try sleeping in a semi-upright position.  Avoid cigarette smoke. If you smoke, quit smoking.  If you need help quitting, ask your health care provider.  Avoid caffeine.  Avoid alcohol.  Rest as needed. Contact a health care provider if:  You have new symptoms.  You cough up pus.  Your cough does not get better after 2-3 weeks, or your cough gets worse.  You cannot control your cough with suppressant medicines and you are losing sleep.  You develop pain that is getting worse or pain that is not controlled with pain medicines.  You have a fever.  You have unexplained weight loss.  You have night sweats. Get help right away if:  You cough up blood.  You have difficulty breathing.  Your heartbeat is very fast. This information is not intended to replace advice given to you by your health care provider. Make sure you discuss any questions you have with your health care provider. Document Released: 04/06/2011 Document Revised: 03/15/2016 Document Reviewed: 12/15/2014 Elsevier Interactive Patient Education  2017 [employer] for Gastroesophageal Reflux Disease, Adult When you have gastroesophageal reflux disease (GERD), the foods you eat and your eating habits are very important. Choosing the right foods can help ease the discomfort of GERD. What general guidelines do I need to follow?  Choose fruits, vegetables, whole grains, low-fat dairy products, and low-fat meat, fish, and poultry.  Limit fats such as oils, salad dressings, butter, nuts, and avocado.  Keep a food diary to identify foods that cause symptoms.  Avoid foods that cause reflux. These  may be different for different people.  Eat frequent small meals instead of three large meals each day.  Eat your meals slowly, in a relaxed setting.  Limit fried foods.  Cook foods using methods other than frying.  Avoid drinking alcohol.  Avoid drinking large amounts of liquids with your meals.  Avoid bending over or lying down until 2-3 hours after eating. What foods are not  recommended? The following are some foods and drinks that may worsen your symptoms: Vegetables  Tomatoes. Tomato juice. Tomato and spaghetti sauce. Chili peppers. Onion and garlic. Horseradish. Fruits  Oranges, grapefruit, and lemon (fruit and juice). Meats  High-fat meats, fish, and poultry. This includes hot dogs, ribs, ham, sausage, salami, and bacon. Dairy  Whole milk and chocolate milk. Sour cream. Cream. Butter. Ice cream. Cream cheese. Beverages  Coffee and tea, with or without caffeine. Carbonated beverages or energy drinks. Condiments  Hot sauce. Barbecue sauce. Sweets/Desserts  Chocolate and cocoa. Donuts. Peppermint and spearmint. Fats and Oils  High-fat foods, including [place] fries and potato chips. Other  Vinegar. Strong spices, such as black pepper, white pepper, red pepper, cayenne, curry powder, cloves, ginger, and chili powder. The items listed above may not be a complete list of foods and beverages to avoid. Contact your dietitian for more information.  This information is not intended to replace advice given to you by your health care provider. Make sure you discuss any questions you have with your health care provider. Document Released: 10/08/2005 Document Revised: 03/15/2016 Document Reviewed: 08/12/2013 Elsevier Interactive Patient Education  2017 [employer].

## 2017-04-30 ENCOUNTER — Ambulatory Visit (INDEPENDENT_AMBULATORY_CARE_PROVIDER_SITE_OTHER): Payer: No Typology Code available for payment source | Admitting: Family Medicine

## 2017-04-30 ENCOUNTER — Other Ambulatory Visit (HOSPITAL_COMMUNITY)
Admission: RE | Admit: 2017-04-30 | Discharge: 2017-04-30 | Disposition: A | Discharge status: Home / Self Care | Payer: No Typology Code available for payment source | Source: Ambulatory Visit | Attending: Family Medicine | Admitting: Family Medicine

## 2017-04-30 DIAGNOSIS — R87810 Cervical high risk human papillomavirus (HPV) DNA test positive: Secondary | ICD-10-CM | POA: Diagnosis not present

## 2017-04-30 DIAGNOSIS — R51 Headache: Secondary | ICD-10-CM | POA: Diagnosis not present

## 2017-04-30 DIAGNOSIS — Z0000 Encounter for general adult medical examination without abnormal findings: Secondary | ICD-10-CM | POA: Insufficient documentation

## 2017-04-30 DIAGNOSIS — Z01419 Encounter for gynecological examination (general) (routine) without abnormal findings: Secondary | ICD-10-CM | POA: Diagnosis not present

## 2017-04-30 DIAGNOSIS — Z124 Encounter for screening for malignant neoplasm of cervix: Secondary | ICD-10-CM | POA: Insufficient documentation

## 2017-04-30 DIAGNOSIS — M542 Cervicalgia: Secondary | ICD-10-CM | POA: Diagnosis not present

## 2017-04-30 DIAGNOSIS — R519 Headache, unspecified: Secondary | ICD-10-CM

## 2017-04-30 MED ORDER — CYCLOBENZAPRINE HCL 10 MG PO TABS: 10.0000 mg | ORAL_TABLET | Freq: Every evening | ORAL | 0 refills | Status: DC | PRN

## 2017-04-30 NOTE — Patient Instructions (Addendum)
If headaches are not improving them please let me know and we can consider physical therapy on your neck.    Health Maintenance, Female Adopting a healthy lifestyle and getting preventive care can go a long way to promote health and wellness. Talk with your health care provider about what schedule of regular examinations is right for you. This is a good chance for you to check in with your provider about disease prevention and staying healthy. In between checkups, there are plenty of things you can do on your own. Experts have done a lot of research about which lifestyle changes and preventive measures are most likely to keep you healthy. Ask your health care provider for more information. Weight and diet Eat a healthy diet  Be sure to include plenty of vegetables, fruits, low-fat dairy products, and lean protein.  Do not eat a lot of foods high in solid fats, added sugars, or salt.  Get regular exercise. This is one of the most important things you can do for your health. ? Most adults should exercise for at least 150 minutes each week. The exercise should increase your heart rate and make you sweat (moderate-intensity exercise). ? Most adults should also do strengthening exercises at least twice a week. This is in addition to the moderate-intensity exercise.  Maintain a healthy weight  Body mass index (BMI) is a measurement that can be used to identify possible weight problems. It estimates body fat based on height and weight. Your health care provider can help determine your BMI and help you achieve or maintain a healthy weight.  For females 2 years of age and older: ? A BMI below 18.5 is considered underweight. ? A BMI of 18.5 to 24.9 is normal. ? A BMI of 25 to 29.9 is considered overweight. ? A BMI of 30 and above is considered obese.  Watch levels of cholesterol and blood lipids  You should start having your blood tested for lipids and cholesterol at 43 years of age, then have  this test every 5 years.  You may need to have your cholesterol levels checked more often if: ? Your lipid or cholesterol levels are high. ? You are older than 43 years of age. ? You are at high risk for heart disease.  Cancer screening Lung Cancer  Lung cancer screening is recommended for adults 51-57 years old who are at high risk for lung cancer because of a history of smoking.  A yearly low-dose CT scan of the lungs is recommended for people who: ? Currently smoke. ? Have quit within the past 15 years. ? Have at least a 30-pack-year history of smoking. A pack year is smoking an average of one pack of cigarettes a day for 1 year.  Yearly screening should continue until it has been 15 years since you quit.  Yearly screening should stop if you develop a health problem that would prevent you from having lung cancer treatment.  Breast Cancer  Practice breast self-awareness. This means understanding how your breasts normally appear and feel.  It also means doing regular breast self-exams. Let your health care provider know about any changes, no matter how small.  If you are in your 20s or 30s, you should have a clinical breast exam (CBE) by a health care provider every 1-3 years as part of a regular health exam.  If you are 49 or older, have a CBE every year. Also consider having a breast X-ray (mammogram) every year.  If you have  a family history of breast cancer, talk to your health care provider about genetic screening.  If you are at high risk for breast cancer, talk to your health care provider about having an MRI and a mammogram every year.  Breast cancer gene (BRCA) assessment is recommended for women who have family members with BRCA-related cancers. BRCA-related cancers include: ? Breast. ? Ovarian. ? Tubal. ? Peritoneal cancers.  Results of the assessment will determine the need for genetic counseling and BRCA1 and BRCA2 testing.  Cervical Cancer Your health care  provider may recommend that you be screened regularly for cancer of the pelvic organs (ovaries, uterus, and vagina). This screening involves a pelvic examination, including checking for microscopic changes to the surface of your cervix (Pap test). You may be encouraged to have this screening done every 3 years, beginning at age 80.  For women ages 34-65, health care providers may recommend pelvic exams and Pap testing every 3 years, or they may recommend the Pap and pelvic exam, combined with testing for human papilloma virus (HPV), every 5 years. Some types of HPV increase your risk of cervical cancer. Testing for HPV may also be done on women of any age with unclear Pap test results.  Other health care providers may not recommend any screening for nonpregnant women who are considered low risk for pelvic cancer and who do not have symptoms. Ask your health care provider if a screening pelvic exam is right for you.  If you have had past treatment for cervical cancer or a condition that could lead to cancer, you need Pap tests and screening for cancer for at least 20 years after your treatment. If Pap tests have been discontinued, your risk factors (such as having a new sexual partner) need to be reassessed to determine if screening should resume. Some women have medical problems that increase the chance of getting cervical cancer. In these cases, your health care provider may recommend more frequent screening and Pap tests.  Colorectal Cancer  This type of cancer can be detected and often prevented.  Routine colorectal cancer screening usually begins at 43 years of age and continues through 43 years of age.  Your health care provider may recommend screening at an earlier age if you have risk factors for colon cancer.  Your health care provider may also recommend using home test kits to check for hidden blood in the stool.  A small camera at the end of a tube can be used to examine your colon  directly (sigmoidoscopy or colonoscopy). This is done to check for the earliest forms of colorectal cancer.  Routine screening usually begins at age 34.  Direct examination of the colon should be repeated every 5-10 years through 43 years of age. However, you may need to be screened more often if early forms of precancerous polyps or small growths are found.  Skin Cancer  Check your skin from head to toe regularly.  Tell your health care provider about any new moles or changes in moles, especially if there is a change in a mole's shape or color.  Also tell your health care provider if you have a mole that is larger than the size of a pencil eraser.  Always use sunscreen. Apply sunscreen liberally and repeatedly throughout the day.  Protect yourself by wearing long sleeves, pants, a wide-brimmed hat, and sunglasses whenever you are outside.  Heart disease, diabetes, and high blood pressure  High blood pressure causes heart disease and increases the risk  of stroke. High blood pressure is more likely to develop in: ? People who have blood pressure in the high end of the normal range (130-139/85-89 mm Hg). ? People who are overweight or obese. ? People who are African American.  If you are 91-72 years of age, have your blood pressure checked every 3-5 years. If you are 77 years of age or older, have your blood pressure checked every year. You should have your blood pressure measured twice-once when you are at a hospital or clinic, and once when you are not at a hospital or clinic. Record the average of the two measurements. To check your blood pressure when you are not at a hospital or clinic, you can use: ? An automated blood pressure machine at a pharmacy. ? A home blood pressure monitor.  If you are between 41 years and 57 years old, ask your health care provider if you should take aspirin to prevent strokes.  Have regular diabetes screenings. This involves taking a blood sample to  check your fasting blood sugar level. ? If you are at a normal weight and have a low risk for diabetes, have this test once every three years after 43 years of age. ? If you are overweight and have a high risk for diabetes, consider being tested at a younger age or more often. Preventing infection Hepatitis B  If you have a higher risk for hepatitis B, you should be screened for this virus. You are considered at high risk for hepatitis B if: ? You were born in a country where hepatitis B is common. Ask your health care provider which countries are considered high risk. ? Your parents were born in a high-risk country, and you have not been immunized against hepatitis B (hepatitis B vaccine). ? You have HIV or AIDS. ? You use needles to inject street drugs. ? You live with someone who has hepatitis B. ? You have had sex with someone who has hepatitis B. ? You get hemodialysis treatment. ? You take certain medicines for conditions, including cancer, organ transplantation, and autoimmune conditions.  Hepatitis C  Blood testing is recommended for: ? Everyone born from [birth year] through 1967. ? Anyone with known risk factors for hepatitis C.  Sexually transmitted infections (STIs)  You should be screened for sexually transmitted infections (STIs) including gonorrhea and chlamydia if: ? You are sexually active and are younger than 43 years of age. ? You are older than 43 years of age and your health care provider tells you that you are at risk for this type of infection. ? Your sexual activity has changed since you were last screened and you are at an increased risk for chlamydia or gonorrhea. Ask your health care provider if you are at risk.  If you do not have HIV, but are at risk, it may be recommended that you take a prescription medicine daily to prevent HIV infection. This is called pre-exposure prophylaxis (PrEP). You are considered at risk if: ? You are sexually active and do not regularly  use condoms or know the HIV status of your partner(s). ? You take drugs by injection. ? You are sexually active with a partner who has HIV.  Talk with your health care provider about whether you are at high risk of being infected with HIV. If you choose to begin PrEP, you should first be tested for HIV. You should then be tested every 3 months for as long as you are taking PrEP. Pregnancy  If you are premenopausal and you may become pregnant, ask your health care provider about preconception counseling.  If you may become pregnant, take 400 to 800 micrograms (mcg) of folic acid every day.  If you want to prevent pregnancy, talk to your health care provider about birth control (contraception). Osteoporosis and menopause  Osteoporosis is a disease in which the bones lose minerals and strength with aging. This can result in serious bone fractures. Your risk for osteoporosis can be identified using a bone density scan.  If you are 75 years of age or older, or if you are at risk for osteoporosis and fractures, ask your health care provider if you should be screened.  Ask your health care provider whether you should take a calcium or vitamin D supplement to lower your risk for osteoporosis.  Menopause may have certain physical symptoms and risks.  Hormone replacement therapy may reduce some of these symptoms and risks. Talk to your health care provider about whether hormone replacement therapy is right for you. Follow these instructions at home:  Schedule regular health, dental, and eye exams.  Stay current with your immunizations.  Do not use any tobacco products including cigarettes, chewing tobacco, or electronic cigarettes.  If you are pregnant, do not drink alcohol.  If you are breastfeeding, limit how much and how often you drink alcohol.  Limit alcohol intake to no more than 1 drink per day for nonpregnant women. One drink equals 12 ounces of beer, 5 ounces of wine, or 1 ounces  of hard liquor.  Do not use street drugs.  Do not share needles.  Ask your health care provider for help if you need support or information about quitting drugs.  Tell your health care provider if you often feel depressed.  Tell your health care provider if you have ever been abused or do not feel safe at home. This information is not intended to replace advice given to you by your health care provider. Make sure you discuss any questions you have with your health care provider. Document Released: 04/23/2011 Document Revised: 03/15/2016 Document Reviewed: 07/12/2015 Elsevier Interactive Patient Education  2018 [employer] Maintenance, Female Adopting a healthy lifestyle and getting preventive care can go a long way to promote health and wellness. Talk with your health care provider about what schedule of regular examinations is right for you. This is a good chance for you to check in with your provider about disease prevention and staying healthy. In between checkups, there are plenty of things you can do on your own. Experts have done a lot of research about which lifestyle changes and preventive measures are most likely to keep you healthy. Ask your health care provider for more information. Weight and diet Eat a healthy diet  Be sure to include plenty of vegetables, fruits, low-fat dairy products, and lean protein.  Do not eat a lot of foods high in solid fats, added sugars, or salt.  Get regular exercise. This is one of the most important things you can do for your health. ? Most adults should exercise for at least 150 minutes each week. The exercise should increase your heart rate and make you sweat (moderate-intensity exercise). ? Most adults should also do strengthening exercises at least twice a week. This is in addition to the moderate-intensity exercise.  Maintain a healthy weight  Body mass index (BMI) is a measurement that can be used to identify possible weight  problems. It estimates body fat based on height  and weight. Your health care provider can help determine your BMI and help you achieve or maintain a healthy weight.  For females 9 years of age and older: ? A BMI below 18.5 is considered underweight. ? A BMI of 18.5 to 24.9 is normal. ? A BMI of 25 to 29.9 is considered overweight. ? A BMI of 30 and above is considered obese.  Watch levels of cholesterol and blood lipids  You should start having your blood tested for lipids and cholesterol at 42 years of age, then have this test every 5 years.  You may need to have your cholesterol levels checked more often if: ? Your lipid or cholesterol levels are high. ? You are older than 43 years of age. ? You are at high risk for heart disease.  Cancer screening Lung Cancer  Lung cancer screening is recommended for adults 64-47 years old who are at high risk for lung cancer because of a history of smoking.  A yearly low-dose CT scan of the lungs is recommended for people who: ? Currently smoke. ? Have quit within the past 15 years. ? Have at least a 30-pack-year history of smoking. A pack year is smoking an average of one pack of cigarettes a day for 1 year.  Yearly screening should continue until it has been 15 years since you quit.  Yearly screening should stop if you develop a health problem that would prevent you from having lung cancer treatment.  Breast Cancer  Practice breast self-awareness. This means understanding how your breasts normally appear and feel.  It also means doing regular breast self-exams. Let your health care provider know about any changes, no matter how small.  If you are in your 20s or 30s, you should have a clinical breast exam (CBE) by a health care provider every 1-3 years as part of a regular health exam.  If you are 70 or older, have a CBE every year. Also consider having a breast X-ray (mammogram) every year.  If you have a family history of breast  cancer, talk to your health care provider about genetic screening.  If you are at high risk for breast cancer, talk to your health care provider about having an MRI and a mammogram every year.  Breast cancer gene (BRCA) assessment is recommended for women who have family members with BRCA-related cancers. BRCA-related cancers include: ? Breast. ? Ovarian. ? Tubal. ? Peritoneal cancers.  Results of the assessment will determine the need for genetic counseling and BRCA1 and BRCA2 testing.  Cervical Cancer Your health care provider may recommend that you be screened regularly for cancer of the pelvic organs (ovaries, uterus, and vagina). This screening involves a pelvic examination, including checking for microscopic changes to the surface of your cervix (Pap test). You may be encouraged to have this screening done every 3 years, beginning at age 44.  For women ages 89-65, health care providers may recommend pelvic exams and Pap testing every 3 years, or they may recommend the Pap and pelvic exam, combined with testing for human papilloma virus (HPV), every 5 years. Some types of HPV increase your risk of cervical cancer. Testing for HPV may also be done on women of any age with unclear Pap test results.  Other health care providers may not recommend any screening for nonpregnant women who are considered low risk for pelvic cancer and who do not have symptoms. Ask your health care provider if a screening pelvic exam is right for you.  If you have had past treatment for cervical cancer or a condition that could lead to cancer, you need Pap tests and screening for cancer for at least 20 years after your treatment. If Pap tests have been discontinued, your risk factors (such as having a new sexual partner) need to be reassessed to determine if screening should resume. Some women have medical problems that increase the chance of getting cervical cancer. In these cases, your health care provider may  recommend more frequent screening and Pap tests.  Colorectal Cancer  This type of cancer can be detected and often prevented.  Routine colorectal cancer screening usually begins at 43 years of age and continues through 43 years of age.  Your health care provider may recommend screening at an earlier age if you have risk factors for colon cancer.  Your health care provider may also recommend using home test kits to check for hidden blood in the stool.  A small camera at the end of a tube can be used to examine your colon directly (sigmoidoscopy or colonoscopy). This is done to check for the earliest forms of colorectal cancer.  Routine screening usually begins at age 36.  Direct examination of the colon should be repeated every 5-10 years through 42 years of age. However, you may need to be screened more often if early forms of precancerous polyps or small growths are found.  Skin Cancer  Check your skin from head to toe regularly.  Tell your health care provider about any new moles or changes in moles, especially if there is a change in a mole's shape or color.  Also tell your health care provider if you have a mole that is larger than the size of a pencil eraser.  Always use sunscreen. Apply sunscreen liberally and repeatedly throughout the day.  Protect yourself by wearing long sleeves, pants, a wide-brimmed hat, and sunglasses whenever you are outside.  Heart disease, diabetes, and high blood pressure  High blood pressure causes heart disease and increases the risk of stroke. High blood pressure is more likely to develop in: ? People who have blood pressure in the high end of the normal range (130-139/85-89 mm Hg). ? People who are overweight or obese. ? People who are African American.  If you are 29-50 years of age, have your blood pressure checked every 3-5 years. If you are 24 years of age or older, have your blood pressure checked every year. You should have your blood  pressure measured twice-once when you are at a hospital or clinic, and once when you are not at a hospital or clinic. Record the average of the two measurements. To check your blood pressure when you are not at a hospital or clinic, you can use: ? An automated blood pressure machine at a pharmacy. ? A home blood pressure monitor.  If you are between 39 years and 82 years old, ask your health care provider if you should take aspirin to prevent strokes.  Have regular diabetes screenings. This involves taking a blood sample to check your fasting blood sugar level. ? If you are at a normal weight and have a low risk for diabetes, have this test once every three years after 43 years of age. ? If you are overweight and have a high risk for diabetes, consider being tested at a younger age or more often. Preventing infection Hepatitis B  If you have a higher risk for hepatitis B, you should be screened for this virus. You  are considered at high risk for hepatitis B if: ? You were born in a country where hepatitis B is common. Ask your health care provider which countries are considered high risk. ? Your parents were born in a high-risk country, and you have not been immunized against hepatitis B (hepatitis B vaccine). ? You have HIV or AIDS. ? You use needles to inject street drugs. ? You live with someone who has hepatitis B. ? You have had sex with someone who has hepatitis B. ? You get hemodialysis treatment. ? You take certain medicines for conditions, including cancer, organ transplantation, and autoimmune conditions.  Hepatitis C  Blood testing is recommended for: ? Everyone born from [birth year] through 1967. ? Anyone with known risk factors for hepatitis C.  Sexually transmitted infections (STIs)  You should be screened for sexually transmitted infections (STIs) including gonorrhea and chlamydia if: ? You are sexually active and are younger than 43 years of age. ? You are older than 43 years  of age and your health care provider tells you that you are at risk for this type of infection. ? Your sexual activity has changed since you were last screened and you are at an increased risk for chlamydia or gonorrhea. Ask your health care provider if you are at risk.  If you do not have HIV, but are at risk, it may be recommended that you take a prescription medicine daily to prevent HIV infection. This is called pre-exposure prophylaxis (PrEP). You are considered at risk if: ? You are sexually active and do not regularly use condoms or know the HIV status of your partner(s). ? You take drugs by injection. ? You are sexually active with a partner who has HIV.  Talk with your health care provider about whether you are at high risk of being infected with HIV. If you choose to begin PrEP, you should first be tested for HIV. You should then be tested every 3 months for as long as you are taking PrEP. Pregnancy  If you are premenopausal and you may become pregnant, ask your health care provider about preconception counseling.  If you may become pregnant, take 400 to 800 micrograms (mcg) of folic acid every day.  If you want to prevent pregnancy, talk to your health care provider about birth control (contraception). Osteoporosis and menopause  Osteoporosis is a disease in which the bones lose minerals and strength with aging. This can result in serious bone fractures. Your risk for osteoporosis can be identified using a bone density scan.  If you are 47 years of age or older, or if you are at risk for osteoporosis and fractures, ask your health care provider if you should be screened.  Ask your health care provider whether you should take a calcium or vitamin D supplement to lower your risk for osteoporosis.  Menopause may have certain physical symptoms and risks.  Hormone replacement therapy may reduce some of these symptoms and risks. Talk to your health care provider about whether hormone  replacement therapy is right for you. Follow these instructions at home:  Schedule regular health, dental, and eye exams.  Stay current with your immunizations.  Do not use any tobacco products including cigarettes, chewing tobacco, or electronic cigarettes.  If you are pregnant, do not drink alcohol.  If you are breastfeeding, limit how much and how often you drink alcohol.  Limit alcohol intake to no more than 1 drink per day for nonpregnant women. One drink equals 12 ounces  of beer, 5 ounces of wine, or 1 ounces of hard liquor.  Do not use street drugs.  Do not share needles.  Ask your health care provider for help if you need support or information about quitting drugs.  Tell your health care provider if you often feel depressed.  Tell your health care provider if you have ever been abused or do not feel safe at home. This information is not intended to replace advice given to you by your health care provider. Make sure you discuss any questions you have with your health care provider. Document Released: 04/23/2011 Document Revised: 03/15/2016 Document Reviewed: 07/12/2015 Elsevier Interactive Patient Education  [employer].

## 2017-04-30 NOTE — Addendum Note (Signed)
Addended by: Delrae Alfred on: 04/30/2017 05:07 PM   Modules accepted: Orders

## 2017-04-30 NOTE — Progress Notes (Signed)
Subjective:     Female Iafrate is a 41 y.o. female and is here for a comprehensive physical exam. The patient reports problems - recent headaches. She says they have been daily for a couple of weeks now. That she's also been expressing right-sided neck pain. She's even been to the chiropractor a couple of times. It does provide some temporary relief of her neck pain. She also changed her diet about 3 weeks ago and says she's been eating much more clean. She's also been more stressed recently and admits that after a recent move she is just not been sleeping as well. She's had a little bit of sensitivity to light. She also had a root canal done about a month ago and has a temporary post in. Which she is going to be replaced next week.  Social History   Social History  . Marital status: Married    Spouse name: [name]  . Number of children: 2  . Years of education: N/A   Occupational History  . self employed     Social History Main Topics  . Smoking status: Never Smoker  . Smokeless tobacco: Never Used  . Alcohol use Yes  . Drug use: No  . Sexual activity: Not on file   Other Topics Concern  . Not on file   Social History Narrative   Has been working out.     Health Maintenance  Topic Date Due  . HIV Screening  10/20/1989  . PAP SMEAR  04/22/2016  . INFLUENZA VACCINE  05/22/2017  . TETANUS/TDAP  03/14/2022    The following portions of the patient's history were reviewed and updated as appropriate: allergies, current medications, past family history, past medical history, past social history, past surgical history and problem list.  Review of Systems A comprehensive review of systems was negative.   Objective:    There were no vitals taken for this visit. General appearance: alert, cooperative and appears stated age Head: Normocephalic, without obvious abnormality, atraumatic Eyes: conj clear, EOMI, PEERLA Ears: normal TM's and external ear canals both ears Nose: Nares normal.  Septum midline. Mucosa normal. No drainage or sinus tenderness. Throat: lips, mucosa, and tongue normal; teeth and gums normal Neck: no adenopathy, no carotid bruit, no JVD, supple, symmetrical, trachea midline and thyroid not enlarged, symmetric, no tenderness/mass/nodules Back: symmetric, no curvature. ROM normal. No CVA tenderness. Lungs: clear to auscultation bilaterally Breasts: normal appearance, no masses or tenderness Heart: regular rate and rhythm, S1, S2 normal, no murmur, click, rub or gallop Abdomen: soft, non-tender; bowel sounds normal; no masses,  no organomegaly Pelvic: cervix normal in appearance, external genitalia normal, no adnexal masses or tenderness, no cervical motion tenderness, rectovaginal septum normal, uterus normal size, shape, and consistency and vagina normal without discharge Extremities: extremities normal, atraumatic, no cyanosis or edema Pulses: 2+ and symmetric Skin: Skin color, texture, turgor normal. No rashes or lesions Lymph nodes: Cervical, supraclavicular, and axillary nodes normal. Neurologic: Alert and oriented X 3, normal strength and tone. Normal symmetric reflexes. Normal coordination and gait    Assessment:    Healthy female exam.      Plan:     See After Visit Summary for Counseling Recommendations   Keep up a regular exercise program and make sure you are eating a healthy diet Try to eat 4 servings of dairy a day, or if you are lactose intolerant take a calcium with vitamin D daily.  Your vaccines are up to date.   Chronic daily headaches-I  suspect this is actually coming from her right-sided neck pain which she's been experiencing. She hasn't really had any significant relief with a chiropractor. Recommend an anti-inflammatory such as Aleve or ibuprofen. Recommend a trial of muscle relaxer at bedtime. Will give her handout on some stretches to do on her own at home. If not improving then consider physical therapy and maybe even  x-rays.  Right sided neck pain-given stretches to do on her own at home as well to muscle relaxer bedtime. If not improving consider formal physical therapy.  Her IUD expired in May. She is planning on her husband actually having a vasectomy she'll come in and have it removed once he has a vasectomy. Did let her know she could get pregnant.

## 2017-05-03 LAB — CYTOLOGY - PAP
DIAGNOSIS: NEGATIVE
HPV 16/18/45 genotyping: NEGATIVE
HPV: DETECTED — AB

## 2017-05-14 LAB — CBC
HCT: 39.6 % (ref 35.0–45.0)
HEMOGLOBIN: 13 g/dL (ref 11.7–15.5)
MCH: 30.3 pg (ref 27.0–33.0)
MCHC: 32.8 g/dL (ref 32.0–36.0)
MCV: 92.3 fL (ref 80.0–100.0)
MPV: 9.2 fL (ref 7.5–12.5)
PLATELETS: 337 10*3/uL (ref 140–400)
RBC: 4.29 MIL/uL (ref 3.80–5.10)
RDW: 13.9 % (ref 11.0–15.0)
WBC: 6.6 10*3/uL (ref 3.8–10.8)

## 2017-05-15 LAB — COMPLETE METABOLIC PANEL WITH GFR
ALBUMIN: 3.9 g/dL (ref 3.6–5.1)
ALK PHOS: 58 U/L (ref 33–115)
ALT: 12 U/L (ref 6–29)
AST: 15 U/L (ref 10–30)
BILIRUBIN TOTAL: 0.4 mg/dL (ref 0.2–1.2)
BUN: 14 mg/dL (ref 7–25)
CALCIUM: 8.7 mg/dL (ref 8.6–10.2)
CO2: 25 mmol/L (ref 20–31)
CREATININE: 0.66 mg/dL (ref 0.50–1.10)
Chloride: 107 mmol/L (ref 98–110)
GFR, Est Non African American: >89 mL/min (ref >=60)
Glucose, Bld: 88 mg/dL (ref 65–99)
Potassium: 4.4 mmol/L (ref 3.5–5.3)
Sodium: 139 mmol/L (ref 135–146)
TOTAL PROTEIN: 6.3 g/dL (ref 6.1–8.1)

## 2017-05-15 LAB — LIPID PANEL
CHOLESTEROL: 196 mg/dL (ref <200)
HDL: 65 mg/dL (ref >50)
LDL CALC: 114 mg/dL — AB (ref <100)
TRIGLYCERIDES: 84 mg/dL (ref <150)
Total CHOL/HDL Ratio: 3 Ratio (ref <5.0)
VLDL: 17 mg/dL (ref <30)

## 2017-05-15 LAB — TSH: TSH: 2.03 mIU/L

## 2017-05-16 ENCOUNTER — Encounter: Payer: Self-pay | Admitting: *Deleted

## 2017-05-31 ENCOUNTER — Encounter: Payer: Self-pay | Admitting: Family Medicine

## 2017-05-31 ENCOUNTER — Ambulatory Visit (INDEPENDENT_AMBULATORY_CARE_PROVIDER_SITE_OTHER): Payer: No Typology Code available for payment source | Admitting: Family Medicine

## 2017-05-31 VITALS — BP 125/74 | HR 70

## 2017-05-31 DIAGNOSIS — Z3009 Encounter for other general counseling and advice on contraception: Secondary | ICD-10-CM | POA: Diagnosis not present

## 2017-05-31 DIAGNOSIS — Z30432 Encounter for removal of intrauterine contraceptive device: Secondary | ICD-10-CM | POA: Diagnosis not present

## 2017-05-31 MED ORDER — NORGESTIMATE-ETH ESTRADIOL 0.25-35 MG-MCG PO TABS: 1.0000 | ORAL_TABLET | Freq: Every day | ORAL | 1 refills | Status: DC

## 2017-05-31 NOTE — Progress Notes (Signed)
   Subjective:    Patient ID: Rhonda Stein, female    DOB: July 21, 1976, 41 y.o.   MRN: [id number]  HPI   Here for IUD removal. It expired in May. Her husband is getting a vasectomy in a couple of months so wants to go on birth control until them.  She does not want to get pregnant.    Review of Systems     Objective:   Physical Exam  Constitutional: She is oriented to person, place, and time. She appears well-developed and well-nourished.  HENT:  Head: Normocephalic and atraumatic.  Eyes: Conjunctivae and EOM are normal.  Cardiovascular: Normal rate.   Pulmonary/Chest: Effort normal.  Genitourinary: Cervix exhibits no motion tenderness and no discharge.  Genitourinary Comments: Blood at the os. String of IUD visualized.   Neurological: She is alert and oriented to person, place, and time.  Skin: Skin is dry. No pallor.  Psychiatric: She has a normal mood and affect. Her behavior is normal.  Vitals reviewed.         Assessment & Plan:  IUD removed. - pt tolerated well.  Indication:IUD expired.  Procedure: Patient having her. Use Fox swabs to remove menstrual blood from the os. Easily visualized the string of IUD. Long forceps used to grab and remove the IUD. Patient tolerated really well. Can take IBU prison or Tylenol as needed post procedure today.  Contraceptive counseling - will give OCP until husband can get his vasectomy.

## 2017-09-05 ENCOUNTER — Ambulatory Visit (INDEPENDENT_AMBULATORY_CARE_PROVIDER_SITE_OTHER): Payer: No Typology Code available for payment source

## 2017-09-05 ENCOUNTER — Ambulatory Visit (INDEPENDENT_AMBULATORY_CARE_PROVIDER_SITE_OTHER): Payer: No Typology Code available for payment source | Admitting: Family Medicine

## 2017-09-05 ENCOUNTER — Encounter: Payer: Self-pay | Admitting: Family Medicine

## 2017-09-05 VITALS — BP 121/71 | HR 91 | Temp 98.2°F | Ht 68.0 in | Wt 196.1 lb

## 2017-09-05 DIAGNOSIS — M25571 Pain in right ankle and joints of right foot: Secondary | ICD-10-CM | POA: Diagnosis not present

## 2017-09-05 NOTE — Progress Notes (Signed)
   Rhonda Stein is a 41 y.o. female who presents to [facility] today for right ankle injury.  Patient suffered an inversion injury to her right she notes pain and swelling at the lateral ankle.  She denies any radiating pain weakness or numbness.  She notes pain is worse with weightbearing and better with rest.  She is just an ankle brace crutches ice and rest which have helped.  She notes the swelling and pain are somewhat better than they were yesterday.   Past Medical History:  Diagnosis Date  . Depression   . Obesity    No past surgical history on file. Social History   Tobacco Use  . Smoking status: Never Smoker  . Smokeless tobacco: Never Used  Substance Use Topics  . Alcohol use: Yes     ROS:  As above   Medications: Current Outpatient Medications  Medication Sig Dispense Refill  . cyclobenzaprine (FLEXERIL) 10 MG tablet Take 1 tablet (10 mg total) by mouth at bedtime as needed for muscle spasms. (Patient not taking: Reported on 05/31/2017) 20 tablet 0  . norgestimate-ethinyl estradiol (ORTHO-CYCLEN,SPRINTEC,PREVIFEM) 0.25-35 MG-MCG tablet Take 1 tablet by mouth daily. 3 Package 1   No current facility-administered medications for this visit.    No Known Allergies   Exam:  BP 121/71   Pulse 91   Temp 98.2 F (36.8 C) (Oral)   Ht 5' 8"  (1.727 m)   Wt 196 lb 1.9 oz (89 kg)   BMI 29.82 kg/m  General: Well Developed, well nourished, and in no acute distress.  Neuro/Psych: Alert and oriented x3, extra-ocular muscles intact, able to move all 4 extremities, sensation grossly intact. Skin: Warm and dry, no rashes noted.  Respiratory: Not using accessory muscles, speaking in full sentences, trachea midline.  Cardiovascular: Pulses palpable, no extremity edema. Abdomen: Does not appear distended. MSK: Right ankle swollen and tender at the lateral malleolus pulses capillary refill sensation are intact distally. Motion is  intact. Stable ligamentous exam.   X-ray right ankle shows no obvious fractures.  Awaiting formal radiology review.     Assessment and Plan: 41 y.o. female with right ankle pain and swelling: Likely ankle sprain of the ATFL.  Discussed treatment options.  Plan for home exercise program weightbearing as tolerated ice rest and NSAIDs.  Recheck in 2 weeks or so.    Orders Placed This Encounter  Procedures  . DG Ankle Complete Right    Standing Status:   Future    Number of Occurrences:   1    Standing Expiration Date:   11/05/2018    Order Specific Question:   Reason for Exam (SYMPTOM  OR DIAGNOSIS REQUIRED)    Answer:   eval pain right lateral ankle after inversion injury    Order Specific Question:   Is patient pregnant?    Answer:   No    Order Specific Question:   Preferred imaging location?    Answer:   [facility]    Order Specific Question:   Radiology Contrast Protocol - do NOT remove file path    Answer:   [email or web address]   No orders of the defined types were placed in this encounter.   Discussed warning signs or symptoms. Please see discharge instructions. Patient expresses understanding.

## 2017-09-05 NOTE — Patient Instructions (Signed)
Thank you for coming in today. Start the ankle exercises.  Ice the ankle for 20 mins 2x daily.  Use crutches as needed.  Use the ankle brace.  Keep it elevated.  Recheck in 2 weeks.    Ankle Sprain, Phase I Rehab Ask your health care provider which exercises are safe for you. Do exercises exactly as told by your health care provider and adjust them as directed. It is normal to feel mild stretching, pulling, tightness, or discomfort as you do these exercises, but you should stop right away if you feel sudden pain or your pain gets worse.Do not begin these exercises until told by your health care provider. Stretching and range of motion exercises These exercises warm up your muscles and joints and improve the movement and flexibility of your lower leg and ankle. These exercises also help to relieve pain and stiffness. Exercise A: Gastroc and soleus stretch  1. Sit on the floor with your left / right leg extended. 2. Loop a belt or towel around the ball of your left / right foot. The ball of your foot is on the walking surface, right under your toes. 3. Keep your left / right ankle and foot relaxed and keep your knee straight while you use the belt or towel to pull your foot toward you. You should feel a gentle stretch behind your calf or knee. 4. Hold this position for __________ seconds, then release to the starting position. Repeat the exercise with your knee bent. You can put a pillow or a rolled bath towel under your knee to support it. You should feel a stretch deep in your calf or at your Achilles tendon. Repeat each stretch __________ times. Complete these stretches __________ times a day. Exercise B: Ankle alphabet  1. Sit with your left / right leg supported at the lower leg. ? Do not rest your foot on anything. ? Make sure your foot has room to move freely. 2. Think of your left / right foot as a paintbrush, and move your foot to trace each letter of the alphabet in the air. Keep  your hip and knee still while you trace. Make the letters as large as you can without feeling discomfort. 3. Trace every letter from A to Z. Repeat __________ times. Complete this exercise __________ times a day. Strengthening exercises These exercises build strength and endurance in your ankle and lower leg. Endurance is the ability to use your muscles for a long time, even after they get tired. Exercise C: Dorsiflexors  1. Secure a rubber exercise band or tube to an object, such as a table leg, that will stay still when the band is pulled. Secure the other end around your left / right foot. 2. Sit on the floor facing the object, with your left / right leg extended. The band or tube should be slightly tense when your foot is relaxed. 3. Slowly bring your foot toward you, pulling the band tighter. 4. Hold this position for __________ seconds. 5. Slowly return your foot to the starting position. Repeat __________ times. Complete this exercise __________ times a day. Exercise D: Plantar flexors  1. Sit on the floor with your left / right leg extended. 2. Loop a rubber exercise tube or band around the ball of your left / right foot. The ball of your foot is on the walking surface, right under your toes. ? Hold the ends of the band or tube in your hands. ? The band or tube should  be slightly tense when your foot is relaxed. 3. Slowly point your foot and toes downward, pushing them away from you. 4. Hold this position for __________ seconds. 5. Slowly return your foot to the starting position. Repeat __________ times. Complete this exercise __________ times a day. Exercise E: Evertors 1. Sit on the floor with your legs straight out in front of you. 2. Loop a rubber exercise band or tube around the ball of your left / right foot. The ball of your foot is on the walking surface, right under your toes. ? Hold the ends of the band in your hands, or secure the band to a stable object. ? The band or  tube should be slightly tense when your foot is relaxed. 3. Slowly push your foot outward, away from your other leg. 4. Hold this position for __________ seconds. 5. Slowly return your foot to the starting position. Repeat __________ times. Complete this exercise __________ times a day. This information is not intended to replace advice given to you by your health care provider. Make sure you discuss any questions you have with your health care provider. Document Released: 05/09/2005 Document Revised: 06/14/2016 Document Reviewed: 08/22/2015 Elsevier Interactive Patient Education  2018 [employer].

## 2017-09-19 ENCOUNTER — Ambulatory Visit: Payer: No Typology Code available for payment source | Admitting: Family Medicine

## 2017-09-19 DIAGNOSIS — Z0189 Encounter for other specified special examinations: Secondary | ICD-10-CM

## 2017-10-02 ENCOUNTER — Other Ambulatory Visit: Payer: Self-pay | Admitting: Family Medicine

## 2018-10-23 ENCOUNTER — Ambulatory Visit (INDEPENDENT_AMBULATORY_CARE_PROVIDER_SITE_OTHER): Payer: No Typology Code available for payment source | Admitting: Family Medicine

## 2018-10-23 ENCOUNTER — Encounter: Payer: Self-pay | Admitting: Family Medicine

## 2018-10-23 ENCOUNTER — Ambulatory Visit: Payer: No Typology Code available for payment source

## 2018-10-23 VITALS — BP 124/66 | HR 67

## 2018-10-23 DIAGNOSIS — H6123 Impacted cerumen, bilateral: Secondary | ICD-10-CM | POA: Diagnosis not present

## 2018-10-23 DIAGNOSIS — H918X3 Other specified hearing loss, bilateral: Secondary | ICD-10-CM | POA: Diagnosis not present

## 2018-10-23 NOTE — Progress Notes (Signed)
  Subjective:     Patient ID: Rhonda Stein, female   DOB: [birth year], 43 y.o.   MRN: [id number]  HPI 43 year old female is here today for removal of cerumen from both ears.  She said is getting so clogged to the point that she feels like she cannot hear well it is impairing her hearing.  She tried some Debrox at home but really was not able to make any progress so is coming in for irrigation.   Today's Vitals   10/23/18 1622  BP: 124/66  Pulse: 67  SpO2: 99%   There is no height or weight on file to calculate BMI.  Review of Systems     Objective:   Physical Exam Vitals signs reviewed.  Constitutional:      Appearance: She is well-developed.  HENT:     Head: Normocephalic and atraumatic.     Comments: Canals blocked by cerumen.  After irrigation I was able to visualize her tympanic membranes well and visualize the ossicle.  No abnormalities. Eyes:     Conjunctiva/sclera: Conjunctivae normal.  Cardiovascular:     Rate and Rhythm: Normal rate.  Pulmonary:     Effort: Pulmonary effort is normal.  Skin:    General: Skin is dry.     Coloration: Skin is not pale.  Neurological:     Mental Status: She is alert and oriented to person, place, and time.  Psychiatric:        Behavior: Behavior normal.        Assessment:     Bilateral cerumen impaction    Plan:     Both ears irrigated.  Patient tolerated well.  Indication: Cerumen impaction of the ear(s) Medical necessity statement: On physical examination, cerumen impairs clinically significant portions of the external auditory canal, and tympanic membrane. Noted obstructive, copious cerumen that cannot be removed without magnification and instrumentations requiring physician skills Consent: Discussed benefits and risks of procedure and verbal consent obtained Procedure: Patient was prepped for the procedure. Utilized an otoscope to assess and take note of the ear canal, the tympanic membrane, and the presence, amount, and  placement of the cerumen. Gentle water irrigation and soft plastic curette was utilized to remove cerumen.  Post procedure examination: shows cerumen was completely removed. Patient tolerated procedure well. The patient is made aware that they may experience temporary vertigo, temporary hearing loss, and temporary discomfort. If these symptom last for more than 24 hours to call the clinic or proceed to the ED.

## 2019-09-01 ENCOUNTER — Encounter: Payer: Self-pay | Admitting: Sports Medicine

## 2019-09-01 ENCOUNTER — Ambulatory Visit (INDEPENDENT_AMBULATORY_CARE_PROVIDER_SITE_OTHER): Payer: No Typology Code available for payment source

## 2019-09-01 ENCOUNTER — Ambulatory Visit (INDEPENDENT_AMBULATORY_CARE_PROVIDER_SITE_OTHER): Payer: No Typology Code available for payment source | Admitting: Sports Medicine

## 2019-09-01 ENCOUNTER — Other Ambulatory Visit: Payer: Self-pay

## 2019-09-01 DIAGNOSIS — M25551 Pain in right hip: Secondary | ICD-10-CM

## 2019-09-01 DIAGNOSIS — G8929 Other chronic pain: Secondary | ICD-10-CM | POA: Diagnosis not present

## 2019-09-01 DIAGNOSIS — M1611 Unilateral primary osteoarthritis, right hip: Secondary | ICD-10-CM | POA: Insufficient documentation

## 2019-09-01 MED ORDER — TRIAZOLAM 0.25 MG PO TABS: ORAL_TABLET | ORAL | 0 refills | Status: DC

## 2019-09-01 NOTE — Assessment & Plan Note (Signed)
Pain and mechanical symptoms, I do think she has a labral tear. X-rays today, we will get her scheduled for an MR arthrogram of the hip, I will see her back for the arthrogram injection. Triazolam for preprocedural anxiolysis.

## 2019-09-01 NOTE — Progress Notes (Signed)
Subjective:    CC: R hip pain  HPI: Rhonda Stein is a pleasant 43 year old with no significant medical history presenting today with ~3 months of a dull/ aching pain in her R anterior hip joint. The pain levels fluctuate with a constant irritation. It is worse at the end of the day following high activity levels. She reportedly walks ~15-20k steps per day as part of her weight loss regimen (15 pound loss) and has not had any periods of rest from this routine since the pain has started. She endorses some locking/ catching without radiation into the leg or back.   I reviewed the past medical history, family history, social history, surgical history, and allergies today and no changes were needed.  Please see the problem list section below in epic for further details.  Past Medical History: Past Medical History:  Diagnosis Date  . Depression   . Obesity    Past Surgical History: No past surgical history on file. Social History: Social History   Socioeconomic History  . Marital status: Married    Spouse name: [name]  . Number of children: 2  . Years of education: Not on file  . Highest education level: Not on file  Occupational History  . Occupation: self employed   Social Needs  . Financial resource strain: Not on file  . Food insecurity    Worry: Not on file    Inability: Not on file  . Transportation needs    Medical: Not on file    Non-medical: Not on file  Tobacco Use  . Smoking status: Never Smoker  . Smokeless tobacco: Never Used  Substance and Sexual Activity  . Alcohol use: Yes  . Drug use: No  . Sexual activity: Not on file  Lifestyle  . Physical activity    Days per week: Not on file    Minutes per session: Not on file  . Stress: Not on file  Relationships  . Social Herbalist on phone: Not on file    Gets together: Not on file    Attends religious service: Not on file    Active member of club or organization: Not on file    Attends meetings of clubs or  organizations: Not on file    Relationship status: Not on file  Other Topics Concern  . Not on file  Social History Narrative   Has been working out.     Family History: No family history on file. Allergies: No Known Allergies Medications: See med rec.  Review of Systems: No fevers, chills, night sweats, weight loss, chest pain, or shortness of breath.   Objective:    General: Well Developed, well nourished, and in no acute distress.  Neuro: Alert and oriented x3, extra-ocular muscles intact, sensation grossly intact.  HEENT: Normocephalic, atraumatic, pupils equal round reactive to light. Skin: Warm and dry, no rashes. Cardiac: Regular rate and rhythm, no lower extremity edema.  Respiratory: Not using accessory muscles, speaking in full sentences.  R Hip: ROM IR: 45 Deg, ER: 45 Deg, Flexion: 120 Deg, Extension: 100 Deg, Abduction: 45 Deg, Adduction: 45 Deg Strength IR: 5/5, ER: 5/5, Flexion: 5/5, Extension: 5/5, Abduction: 5/5, Adduction: 5/5 Pelvic alignment unremarkable to inspection and palpation. Standing hip rotation and gait without trendelenburg sign / unsteadiness. Greater trochanter without tenderness to palpation. No tenderness over piriformis. Pain with FABER and FADIR. No SI joint tenderness and normal minimal SI movement.  A/P: Rhonda Stein is experiencing 3 months of dull  R sided hip pain that is worse with walking. She endorses some mechanical symptoms. There has been no radiation to the back or legs. Because of her prolonged symptoms with grinding/ catching, and positive FABER and FADIR tests her symptoms are likely due to a labral tear. Due to the location of her pain and worsening with activity OA is on my differential. An MRI Arthrogram has been ordered to visualize pathology and direct future therapy. Due to anxiety surrounding the procedure she will come back for the study next week and triazolam has been ordered to reduce her apprehension.   Impression and  Recommendations:    Chronic right hip pain Pain and mechanical symptoms, I do think she has a labral tear. X-rays today, we will get her scheduled for an MR arthrogram of the hip, I will see her back for the arthrogram injection. Triazolam for preprocedural anxiolysis.   ___________________________________________ Gwen Her. Dianah Field, M.D., ABFM., CAQSM. Primary Care and Sports Medicine [facility]  Adjunct Professor of [employer] [facility]

## 2019-09-08 ENCOUNTER — Ambulatory Visit (INDEPENDENT_AMBULATORY_CARE_PROVIDER_SITE_OTHER): Payer: No Typology Code available for payment source | Admitting: Sports Medicine

## 2019-09-08 ENCOUNTER — Ambulatory Visit (INDEPENDENT_AMBULATORY_CARE_PROVIDER_SITE_OTHER): Payer: No Typology Code available for payment source

## 2019-09-08 ENCOUNTER — Other Ambulatory Visit: Payer: Self-pay

## 2019-09-08 DIAGNOSIS — M25551 Pain in right hip: Secondary | ICD-10-CM | POA: Diagnosis not present

## 2019-09-08 DIAGNOSIS — G8929 Other chronic pain: Secondary | ICD-10-CM

## 2019-09-08 MED ORDER — GADOBUTROL 1 MMOL/ML IV SOLN
1.0000 mL | Freq: Once | INTRAVENOUS | Status: AC | PRN
  Administered 2019-09-08: 16:00:00 1 mL via INTRAVENOUS

## 2019-09-08 NOTE — Assessment & Plan Note (Signed)
Suspected labral tear, injection for MR arthrogram today. She will follow up with me tomorrow for MRI results.

## 2019-09-08 NOTE — Progress Notes (Signed)
   Procedure: Real-time Ultrasound Guided gadolinium contrast injection of right hip joint Device: Samsung HS60  Verbal informed consent obtained.  Time-out conducted.  Noted no overlying erythema, induration, or other signs of local infection.  Skin prepped in a sterile fashion.  Local anesthesia: Topical Ethyl chloride.  With sterile technique and under real time ultrasound guidance: Using a 22-gauge spinal needle advanced to the femoral head/neck junction, contacted bone and then injected 1 cc Kenalog 40, 2 cc lidocaine, 2 cc bupivacaine, syringe switched and 0.1 cc gadolinium injected, syringe again switched and 10 cc saline used to flush the needle and distend the joint. Joint visualized and capsule seen distending confirming intra-articular placement of contrast material and medication. Completed without difficulty  Advised to call if fevers/chills, erythema, induration, drainage, or persistent bleeding.  Images permanently stored and available for review in the ultrasound unit.  Impression: Technically successful ultrasound guided gadolinium contrast injection for MR arthrography.  Please see separate MR arthrogram report.   Chronic right hip pain Suspected labral tear, injection for MR arthrogram today. She will follow up with me tomorrow for MRI results.

## 2019-09-09 ENCOUNTER — Ambulatory Visit (INDEPENDENT_AMBULATORY_CARE_PROVIDER_SITE_OTHER): Payer: No Typology Code available for payment source | Admitting: Sports Medicine

## 2019-09-09 DIAGNOSIS — M1611 Unilateral primary osteoarthritis, right hip: Secondary | ICD-10-CM | POA: Diagnosis not present

## 2019-09-09 NOTE — Assessment & Plan Note (Signed)
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 We will treat this as run-of-the-mill hip arthritis, rehab exercises given, we discussed low impact exercises, Tylenol as needed, and aggressive weight loss. She can do a Doximity visit with me in a month.

## 2019-09-09 NOTE — Progress Notes (Signed)
  Subjective:    CC: Follow-up  HPI: This is a pleasant 43 year old female, we did a MR arthrogram of her right hip, she returns today feeling pretty good.  I reviewed the past medical history, family history, social history, surgical history, and allergies today and no changes were needed.  Please see the problem list section below in epic for further details.  Past Medical History: Past Medical History:  Diagnosis Date  . Depression   . Obesity    Past Surgical History: No past surgical history on file. Social History: Social History   Socioeconomic History  . Marital status: Married    Spouse name: [name]  . Number of children: 2  . Years of education: Not on file  . Highest education level: Not on file  Occupational History  . Occupation: self employed   Social Needs  . Financial resource strain: Not on file  . Food insecurity    Worry: Not on file    Inability: Not on file  . Transportation needs    Medical: Not on file    Non-medical: Not on file  Tobacco Use  . Smoking status: Never Smoker  . Smokeless tobacco: Never Used  Substance and Sexual Activity  . Alcohol use: Yes  . Drug use: No  . Sexual activity: Not on file  Lifestyle  . Physical activity    Days per week: Not on file    Minutes per session: Not on file  . Stress: Not on file  Relationships  . Social Herbalist on phone: Not on file    Gets together: Not on file    Attends religious service: Not on file    Active member of club or organization: Not on file    Attends meetings of clubs or organizations: Not on file    Relationship status: Not on file  Other Topics Concern  . Not on file  Social History Narrative   Has been working out.     Family History: No family history on file. Allergies: No Known Allergies Medications: See med rec.  Review of Systems: No fevers, chills, night sweats, weight loss, chest pain, or shortness of breath.   Objective:    General: Well  Developed, well nourished, and in no acute distress.  Neuro: Alert and oriented x3, extra-ocular muscles intact, sensation grossly intact.  HEENT: Normocephalic, atraumatic, pupils equal round reactive to light, neck supple, no masses, no lymphadenopathy, thyroid nonpalpable.  Skin: Warm and dry, no rashes. Cardiac: Regular rate and rhythm, no murmurs rubs or gallops, no lower extremity edema.  Respiratory: Clear to auscultation bilaterally. Not using accessory muscles, speaking in full sentences.  Impression and Recommendations:    Primary osteoarthritis of right hip Hip osteoarthritis on MR arthrogram with degenerative fraying of the labrum without an overt tear. We will treat this as run-of-the-mill hip arthritis, rehab exercises given, we discussed low impact exercises, Tylenol as needed, and aggressive weight loss. She can do a Doximity visit with me in a month.   ___________________________________________ Gwen Her. Dianah Field, M.D., ABFM., CAQSM. Primary Care and Sports Medicine [facility]  Adjunct Professor of [employer] [place] [employer] [facility] [employer]

## 2019-10-09 ENCOUNTER — Encounter: Payer: Self-pay | Admitting: Sports Medicine

## 2019-10-09 ENCOUNTER — Ambulatory Visit (INDEPENDENT_AMBULATORY_CARE_PROVIDER_SITE_OTHER): Payer: No Typology Code available for payment source | Admitting: Sports Medicine

## 2019-10-09 DIAGNOSIS — R350 Frequency of micturition: Secondary | ICD-10-CM

## 2019-10-09 DIAGNOSIS — M1611 Unilateral primary osteoarthritis, right hip: Secondary | ICD-10-CM | POA: Diagnosis not present

## 2019-10-09 NOTE — Assessment & Plan Note (Signed)
MR arthrogram showed hip osteoarthritis with degenerative fraying of the labrum, no overt tears. We did place some steroid in the arthrogram injection. She is pain-free now, return as needed.

## 2019-10-09 NOTE — Progress Notes (Signed)
Virtual Visit via WebEx/MyChart   I connected with  Rhonda Stein  on 10/09/19 via WebEx/MyChart/Doximity Video and verified that I am speaking with the correct person using two identifiers.   I discussed the limitations, risks, security and privacy concerns of performing an evaluation and management service by WebEx/MyChart/Doximity Video, including the higher likelihood of inaccurate diagnosis and treatment, and the availability of in person appointments.  We also discussed the likely need of an additional face to face encounter for complete and high quality delivery of care.  I also discussed with the patient that there may be a patient responsible charge related to this service. The patient expressed understanding and wishes to proceed.  Provider location is either at home or medical facility. Patient location is at their home, different from provider location. People involved in care of the patient during this telehealth encounter were myself, my nurse/medical assistant, and my front office/scheduling team member.  Subjective:    CC: Follow-up  HPI: Hip osteoarthritis: Resolved after hip arthrogram injection.  Frequent urination: Rhonda Stein also complains of frequent urination, random episodes present for several months now, she wonders if she might have prediabetes.  I reviewed the past medical history, family history, social history, surgical history, and allergies today and no changes were needed.  Please see the problem list section below in epic for further details.  Past Medical History: Past Medical History:  Diagnosis Date  . Depression   . Obesity    Past Surgical History: No past surgical history on file. Social History: Social History   Socioeconomic History  . Marital status: Married    Spouse name: Rhonda Stein  . Number of children: 2  . Years of education: Not on file  . Highest education level: Not on file  Occupational History  . Occupation: self employed     Tobacco Use  . Smoking status: Never Smoker  . Smokeless tobacco: Never Used  Substance and Sexual Activity  . Alcohol use: Yes  . Drug use: No  . Sexual activity: Not on file  Other Topics Concern  . Not on file  Social History Narrative   Has been working out.     Social Determinants of Health   Financial Resource Strain:   . Difficulty of Paying Living Expenses: Not on file  Food Insecurity:   . Worried About Charity fundraiser in the Last Year: Not on file  . Ran Out of Food in the Last Year: Not on file  Transportation Needs:   . Lack of Transportation (Medical): Not on file  . Lack of Transportation (Non-Medical): Not on file  Physical Activity:   . Days of Exercise per Week: Not on file  . Minutes of Exercise per Session: Not on file  Stress:   . Feeling of Stress : Not on file  Social Connections:   . Frequency of Communication with Friends and Family: Not on file  . Frequency of Social Gatherings with Friends and Family: Not on file  . Attends Religious Services: Not on file  . Active Member of Clubs or Organizations: Not on file  . Attends Archivist Meetings: Not on file  . Marital Status: Not on file   Family History: No family history on file. Allergies: No Known Allergies Medications: See med rec.  Review of Systems: No fevers, chills, night sweats, weight loss, chest pain, or shortness of breath.   Objective:    General: Speaking full sentences, no audible heavy breathing.  Sounds alert  and appropriately interactive.  Appears well.  Face symmetric.  Extraocular movements intact.  Pupils equal and round.  No nasal flaring or accessory muscle use visualized.  No other physical exam performed due to the non-physical nature of this visit.  Impression and Recommendations:    Primary osteoarthritis of right hip MR arthrogram showed hip osteoarthritis with degenerative fraying of the labrum, no overt tears. We did place some steroid in the  arthrogram injection. She is pain-free now, return as needed.  Frequent urination Ears, symptoms sound more like overactive bladder but she certainly needs a urinalysis, and routine labs, she does have an appointment set up with her PCP.  I discussed the above assessment and treatment plan with the patient. The patient was provided an opportunity to ask questions and all were answered. The patient agreed with the plan and demonstrated an understanding of the instructions.   The patient was advised to call back or seek an in-person evaluation if the symptoms worsen or if the condition fails to improve as anticipated.   I provided 25 minutes of non-face-to-face time during this encounter, 15 minutes of additional time was needed to gather information, review chart, records, communicate/coordinate with staff remotely, troubleshooting the multiple errors that we get every time when trying to do video calls through the electronic medical record, WebEx, and Doximity, restart the encounter multiple times due to instability of the software, as well as complete documentation.   ___________________________________________ Gwen Her. Dianah Field, M.D., ABFM., CAQSM. Primary Care and Sports Medicine [facility]  Adjunct Professor of [place] [employer] [facility] [employer]

## 2019-10-09 NOTE — Assessment & Plan Note (Signed)
Ears, symptoms sound more like overactive bladder but she certainly needs a urinalysis, and routine labs, she does have an appointment set up with her PCP.

## 2019-10-21 ENCOUNTER — Encounter: Payer: No Typology Code available for payment source | Admitting: Family Medicine

## 2019-11-25 ENCOUNTER — Ambulatory Visit (INDEPENDENT_AMBULATORY_CARE_PROVIDER_SITE_OTHER): Payer: No Typology Code available for payment source | Admitting: Family Medicine

## 2019-11-25 ENCOUNTER — Encounter: Payer: Self-pay | Admitting: Family Medicine

## 2019-11-25 ENCOUNTER — Other Ambulatory Visit: Payer: Self-pay

## 2019-11-25 VITALS — BP 137/73 | HR 58 | Ht 68.0 in | Wt 215.0 lb

## 2019-11-25 DIAGNOSIS — Z0000 Encounter for general adult medical examination without abnormal findings: Secondary | ICD-10-CM

## 2019-11-25 DIAGNOSIS — R350 Frequency of micturition: Secondary | ICD-10-CM

## 2019-11-25 DIAGNOSIS — N912 Amenorrhea, unspecified: Secondary | ICD-10-CM | POA: Diagnosis not present

## 2019-11-25 DIAGNOSIS — Z1231 Encounter for screening mammogram for malignant neoplasm of breast: Secondary | ICD-10-CM

## 2019-11-25 LAB — POCT URINALYSIS DIP (CLINITEK)
Bilirubin, UA: NEGATIVE
Blood, UA: NEGATIVE
Glucose, UA: NEGATIVE mg/dL
Ketones, POC UA: NEGATIVE mg/dL
Leukocytes, UA: NEGATIVE
Nitrite, UA: NEGATIVE
POC PROTEIN,UA: NEGATIVE
Spec Grav, UA: 1.02 (ref 1.010–1.025)
Urobilinogen, UA: 0.2 E.U./dL
pH, UA: 7 (ref 5.0–8.0)

## 2019-11-25 NOTE — Progress Notes (Signed)
Subjective:     Rhonda Stein is a 44 y.o. female and is here for a comprehensive physical exam. The patient reports problems - She does have a couple of concerns today.  She has never had a mammogram.  Pap smear is up-to-date.  She reports that she has not had a period since the summer and was even starting to experience some hot flashes that were really quite frequent.  She says the hot flashes eventually got better but she still never had a menstrual cycle.  She also reports that she is been having some urinary frequency and urgency been going on for months.  No pain with urination no blood.  No foul odor.  She is just noticing that she cannot wait as long to go to the bathroom when she first gets an urge otherwise she will feel like she is going to have an accident.  No stress incontinence with coughing or sneezing  She also just wanted to let me know that she has been having some knee problems and has been seeing Dr. Dianah Field for that.  Social History   Socioeconomic History  . Marital status: Married    Spouse name: [name]  . Number of children: 2  . Years of education: Not on file  . Highest education level: Not on file  Occupational History  . Occupation: self employed   Tobacco Use  . Smoking status: Never Smoker  . Smokeless tobacco: Never Used  Substance and Sexual Activity  . Alcohol use: Yes  . Drug use: No  . Sexual activity: Not on file  Other Topics Concern  . Not on file  Social History Narrative   Has been working out.     Social Determinants of Health   Financial Resource Strain:   . Difficulty of Paying Living Expenses: Not on file  Food Insecurity:   . Worried About Charity fundraiser in the Last Year: Not on file  . Ran Out of Food in the Last Year: Not on file  Transportation Needs:   . Lack of Transportation (Medical): Not on file  . Lack of Transportation (Non-Medical): Not on file  Physical Activity:   . Days of Exercise per Week: Not on file   . Minutes of Exercise per Session: Not on file  Stress:   . Feeling of Stress : Not on file  Social Connections:   . Frequency of Communication with Friends and Family: Not on file  . Frequency of Social Gatherings with Friends and Family: Not on file  . Attends Religious Services: Not on file  . Active Member of Clubs or Organizations: Not on file  . Attends Archivist Meetings: Not on file  . Marital Status: Not on file  Intimate Partner Violence:   . Fear of Current or Ex-Partner: Not on file  . Emotionally Abused: Not on file  . Physically Abused: Not on file  . Sexually Abused: Not on file   Health Maintenance  Topic Date Due  . INFLUENZA VACCINE  01/20/2020 (Originally 05/23/2019)  . HIV Screening  11/24/2028 (Originally 10/20/1989)  . TETANUS/TDAP  03/14/2022  . PAP SMEAR-Modifier  04/30/2022    The following portions of the patient's history were reviewed and updated as appropriate: allergies, current medications, past family history, past medical history, past social history, past surgical history and problem list.  Review of Systems A comprehensive review of systems was negative.   Objective:    BP 137/73   Pulse (!) 58  Ht 5' 8"  (1.727 m)   Wt 215 lb (97.5 kg)   SpO2 100%   BMI 32.69 kg/m  General appearance: alert, cooperative and appears stated age Head: Normocephalic, without obvious abnormality, atraumatic Eyes: conj clear, EOMI, PEERLA Ears: normal TM's and external ear canals both ears Nose: Nares normal. Septum midline. Mucosa normal. No drainage or sinus tenderness. Throat: lips, mucosa, and tongue normal; teeth and gums normal Neck: no adenopathy, no carotid bruit, no JVD, supple, symmetrical, trachea midline and thyroid not enlarged, symmetric, no tenderness/mass/nodules Back: symmetric, no curvature. ROM normal. No CVA tenderness. Lungs: clear to auscultation bilaterally Breasts: normal appearance, no masses or tenderness Heart: regular  rate and rhythm, S1, S2 normal, no murmur, click, rub or gallop Abdomen: soft, non-tender; bowel sounds normal; no masses,  no organomegaly Extremities: extremities normal, atraumatic, no cyanosis or edema Pulses: 2+ and symmetric Skin: Skin color, texture, turgor normal. No rashes or lesions Lymph nodes: Cervical, supraclavicular, and axillary nodes normal. Neurologic: Alert and oriented X 3, normal strength and tone. Normal symmetric reflexes. Normal coordination and gait    Assessment:    Healthy female exam.      Plan:     See After Visit Summary for Counseling Recommendations   Keep up a regular exercise program and make sure you are eating a healthy diet Try to eat 4 servings of dairy a day, or if you are lactose intolerant take a calcium with vitamin D daily.  Your vaccines are up to date.  We will schedule mammogram.  Amenorrhea -will check hormones to see if it may be that she is postmenopausal especially since she was also experiencing hot flashes at the same time but also check for thyroid disorder she is a little bit on the young side to be going into menopause but it certainly possible.  She also has noticed a decrease in sleep quality.  Urinary frequency and urgency -we will check urinalysis just to rule out infection etc.  If negative then consider trial medication for overactive bladder.  Symptoms are still mild.  Consider other causes such as bladder prolapse etc.

## 2019-11-25 NOTE — Patient Instructions (Signed)
Health Maintenance, Female Adopting a healthy lifestyle and getting preventive care are important in promoting health and wellness. Ask your health care provider about:  The right schedule for you to have regular tests and exams.  Things you can do on your own to prevent diseases and keep yourself healthy. What should I know about diet, weight, and exercise? Eat a healthy diet   Eat a diet that includes plenty of vegetables, fruits, low-fat dairy products, and lean protein.  Do not eat a lot of foods that are high in solid fats, added sugars, or sodium. Maintain a healthy weight Body mass index (BMI) is used to identify weight problems. It estimates body fat based on height and weight. Your health care provider can help determine your BMI and help you achieve or maintain a healthy weight. Get regular exercise Get regular exercise. This is one of the most important things you can do for your health. Most adults should:  Exercise for at least 150 minutes each week. The exercise should increase your heart rate and make you sweat (moderate-intensity exercise).  Do strengthening exercises at least twice a week. This is in addition to the moderate-intensity exercise.  Spend less time sitting. Even light physical activity can be beneficial. Watch cholesterol and blood lipids Have your blood tested for lipids and cholesterol at 46 years of age, then have this test every 5 years. Have your cholesterol levels checked more often if:  Your lipid or cholesterol levels are high.  You are older than 46 years of age.  You are at high risk for heart disease. What should I know about cancer screening? Depending on your health history and family history, you may need to have cancer screening at various ages. This may include screening for:  Breast cancer.  Cervical cancer.  Colorectal cancer.  Skin cancer.  Lung cancer. What should I know about heart disease, diabetes, and high blood  pressure? Blood pressure and heart disease  High blood pressure causes heart disease and increases the risk of stroke. This is more likely to develop in people who have high blood pressure readings, are of African descent, or are overweight.  Have your blood pressure checked: ? Every 3-5 years if you are 18-39 years of age. ? Every year if you are 40 years old or older. Diabetes Have regular diabetes screenings. This checks your fasting blood sugar level. Have the screening done:  Once every three years after age 40 if you are at a normal weight and have a low risk for diabetes.  More often and at a younger age if you are overweight or have a high risk for diabetes. What should I know about preventing infection? Hepatitis B If you have a higher risk for hepatitis B, you should be screened for this virus. Talk with your health care provider to find out if you are at risk for hepatitis B infection. Hepatitis C Testing is recommended for:  Everyone born from 1945 through 1965.  Anyone with known risk factors for hepatitis C. Sexually transmitted infections (STIs)  Get screened for STIs, including gonorrhea and chlamydia, if: ? You are sexually active and are younger than 46 years of age. ? You are older than 46 years of age and your health care provider tells you that you are at risk for this type of infection. ? Your sexual activity has changed since you were last screened, and you are at increased risk for chlamydia or gonorrhea. Ask your health care provider if   you are at risk.  Ask your health care provider about whether you are at high risk for HIV. Your health care provider may recommend a prescription medicine to help prevent HIV infection. If you choose to take medicine to prevent HIV, you should first get tested for HIV. You should then be tested every 3 months for as long as you are taking the medicine. Pregnancy  If you are about to stop having your period (premenopausal) and  you may become pregnant, seek counseling before you get pregnant.  Take 400 to 800 micrograms (mcg) of folic acid every day if you become pregnant.  Ask for birth control (contraception) if you want to prevent pregnancy. Osteoporosis and menopause Osteoporosis is a disease in which the bones lose minerals and strength with aging. This can result in bone fractures. If you are 65 years old or older, or if you are at risk for osteoporosis and fractures, ask your health care provider if you should:  Be screened for bone loss.  Take a calcium or vitamin D supplement to lower your risk of fractures.  Be given hormone replacement therapy (HRT) to treat symptoms of menopause. Follow these instructions at home: Lifestyle  Do not use any products that contain nicotine or tobacco, such as cigarettes, e-cigarettes, and chewing tobacco. If you need help quitting, ask your health care provider.  Do not use street drugs.  Do not share needles.  Ask your health care provider for help if you need support or information about quitting drugs. Alcohol use  Do not drink alcohol if: ? Your health care provider tells you not to drink. ? You are pregnant, may be pregnant, or are planning to become pregnant.  If you drink alcohol: ? Limit how much you use to 0-1 drink a day. ? Limit intake if you are breastfeeding.  Be aware of how much alcohol is in your drink. In the [place] one drink equals one 12 oz bottle of beer (355 mL), one 5 oz glass of wine (148 mL), or one 1 oz glass of hard liquor (44 mL). General instructions  Schedule regular health, dental, and eye exams.  Stay current with your vaccines.  Tell your health care provider if: ? You often feel depressed. ? You have ever been abused or do not feel safe at home. Summary  Adopting a healthy lifestyle and getting preventive care are important in promoting health and wellness.  Follow your health care provider's instructions about healthy  diet, exercising, and getting tested or screened for diseases.  Follow your health care provider's instructions on monitoring your cholesterol and blood pressure. This information is not intended to replace advice given to you by your health care provider. Make sure you discuss any questions you have with your health care provider. Document Revised: 10/01/2018 Document Reviewed: 10/01/2018 Elsevier Patient Education  2020 Elsevier Inc.  

## 2019-11-26 LAB — LIPID PANEL W/REFLEX DIRECT LDL
Cholesterol: 211 mg/dL — ABNORMAL HIGH (ref <200)
HDL: 71 mg/dL (ref >=50)
LDL Cholesterol (Calc): 124 mg/dL (calc) — ABNORMAL HIGH
Non-HDL Cholesterol (Calc): 140 mg/dL (calc) — ABNORMAL HIGH (ref <130)
Total CHOL/HDL Ratio: 3 (calc) (ref <5.0)
Triglycerides: 66 mg/dL (ref <150)

## 2019-11-26 LAB — CBC
HCT: 39.9 % (ref 35.0–45.0)
Hemoglobin: 13.3 g/dL (ref 11.7–15.5)
MCH: 28.7 pg (ref 27.0–33.0)
MCHC: 33.3 g/dL (ref 32.0–36.0)
MCV: 86.2 fL (ref 80.0–100.0)
MPV: 9.1 fL (ref 7.5–12.5)
Platelets: 359 10*3/uL (ref 140–400)
RBC: 4.63 10*6/uL (ref 3.80–5.10)
RDW: 13.1 % (ref 11.0–15.0)
WBC: 4.5 10*3/uL (ref 3.8–10.8)

## 2019-11-26 LAB — COMPLETE METABOLIC PANEL WITH GFR
AG Ratio: 1.6 (calc) (ref 1.0–2.5)
ALT: 14 U/L (ref 6–29)
AST: 17 U/L (ref 10–35)
Albumin: 4.4 g/dL (ref 3.6–5.1)
Alkaline phosphatase (APISO): 65 U/L (ref 31–125)
BUN: 11 mg/dL (ref 7–25)
CO2: 28 mmol/L (ref 20–32)
Calcium: 9.7 mg/dL (ref 8.6–10.2)
Chloride: 105 mmol/L (ref 98–110)
Creat: 0.78 mg/dL (ref 0.50–1.10)
GFR, Est African American: 106 mL/min/{1.73_m2} (ref >=60)
GFR, Est Non African American: 92 mL/min/{1.73_m2} (ref >=60)
Globulin: 2.7 g/dL (calc) (ref 1.9–3.7)
Glucose, Bld: 99 mg/dL (ref 65–99)
Potassium: 4.2 mmol/L (ref 3.5–5.3)
Sodium: 140 mmol/L (ref 135–146)
Total Bilirubin: 0.4 mg/dL (ref 0.2–1.2)
Total Protein: 7.1 g/dL (ref 6.1–8.1)

## 2019-11-26 LAB — HEMOGLOBIN A1C
Hgb A1c MFr Bld: 5.5 % of total Hgb (ref <5.7)
Mean Plasma Glucose: 111 (calc)
eAG (mmol/L): 6.2 (calc)

## 2019-11-26 LAB — TSH: TSH: 1.57 mIU/L

## 2019-11-26 LAB — LUTEINIZING HORMONE: LH: 28.4 m[IU]/mL

## 2019-11-26 LAB — FOLLICLE STIMULATING HORMONE: FSH: 69.6 m[IU]/mL

## 2019-11-26 LAB — PROGESTERONE: Progesterone: <0.5 ng/mL

## 2019-11-26 LAB — ESTRADIOL: Estradiol: <15 pg/mL

## 2019-11-27 ENCOUNTER — Other Ambulatory Visit: Payer: Self-pay | Admitting: *Deleted

## 2019-11-27 MED ORDER — CELECOXIB 200 MG PO CAPS: 200.0000 mg | ORAL_CAPSULE | Freq: Two times a day (BID) | ORAL | 2 refills | Status: DC | PRN

## 2019-11-27 MED ORDER — MIRABEGRON ER 25 MG PO TB24: 25.0000 mg | ORAL_TABLET | Freq: Every day | ORAL | 2 refills | Status: DC

## 2019-11-27 NOTE — Addendum Note (Signed)
Addended by: Beatrice Lecher D on: 11/27/2019 11:31 AM   Modules accepted: Orders

## 2019-11-27 NOTE — Addendum Note (Signed)
Addended by: Beatrice Lecher D on: 11/27/2019 11:05 AM   Modules accepted: Orders

## 2019-11-27 NOTE — Progress Notes (Signed)
Called pharmacy and spoke w/Bret asked that the presription for celebrex be d/c'd.Elouise Munroe, [place]

## 2019-12-02 ENCOUNTER — Ambulatory Visit: Payer: No Typology Code available for payment source

## 2019-12-07 ENCOUNTER — Telehealth: Payer: Self-pay | Admitting: *Deleted

## 2019-12-07 MED ORDER — OXYBUTYNIN CHLORIDE ER 5 MG PO TB24: 5.0000 mg | ORAL_TABLET | Freq: Two times a day (BID) | ORAL | 2 refills | Status: DC

## 2019-12-07 NOTE — Telephone Encounter (Signed)
[facility] sent a fax stating that she requested a cheaper alternative to Myrbetriq 25 mg I.e Ditropan.   Dr. Madilyn Fireman will send ditropan 5 mg bid.Rhonda Stein, [place]

## 2019-12-17 ENCOUNTER — Telehealth: Payer: Self-pay | Admitting: Family Medicine

## 2019-12-17 DIAGNOSIS — N951 Menopausal and female climacteric states: Secondary | ICD-10-CM

## 2019-12-17 NOTE — Telephone Encounter (Signed)
I know she had mentioned it at her physical and then we did some blood work.  Is she wanting to consider hormonal treatment.  This can slightly increase her risk for breast cancer or if she wanting to consider nonhormonal treatment.  Just let me know.  I can send something in and then we can follow-up in about 6 weeks to see how she is doing on it.

## 2019-12-17 NOTE — Telephone Encounter (Signed)
Patient calls and states having 4 hot flashes an hour and wants to know if you can give her something she can not take it. KG LPN

## 2019-12-18 NOTE — Telephone Encounter (Signed)
Patient would like to do nonhormonal treatment. Please send to pharmacy. KG LPN

## 2019-12-21 MED ORDER — VENLAFAXINE HCL ER 37.5 MG PO CP24: 37.5000 mg | ORAL_CAPSULE | Freq: Every day | ORAL | 2 refills | Status: DC

## 2019-12-21 NOTE — Telephone Encounter (Signed)
Ok rx sent to pharmacy

## 2019-12-21 NOTE — Telephone Encounter (Signed)
Called patient to let her know, unable to LM at 4:52pm, voicemail box full.

## 2019-12-24 NOTE — Telephone Encounter (Signed)
Pt has p/u medication.Rhonda Stein, Rhonda Stein, CMA

## 2020-01-19 ENCOUNTER — Encounter: Payer: Self-pay | Admitting: Family Medicine

## 2020-01-19 DIAGNOSIS — N951 Menopausal and female climacteric states: Secondary | ICD-10-CM

## 2020-01-27 MED ORDER — VENLAFAXINE HCL ER 75 MG PO CP24: 75.0000 mg | ORAL_CAPSULE | Freq: Every day | ORAL | 0 refills | Status: DC

## 2020-01-27 NOTE — Addendum Note (Signed)
Addended by: Beatrice Lecher D on: 01/27/2020 08:43 AM   Modules accepted: Orders

## 2020-05-06 ENCOUNTER — Other Ambulatory Visit: Payer: Self-pay | Admitting: *Deleted

## 2020-05-06 DIAGNOSIS — N951 Menopausal and female climacteric states: Secondary | ICD-10-CM

## 2020-05-06 MED ORDER — VENLAFAXINE HCL ER 75 MG PO CP24: 75.0000 mg | ORAL_CAPSULE | Freq: Every day | ORAL | 0 refills | Status: DC

## 2020-05-09 ENCOUNTER — Other Ambulatory Visit: Payer: Self-pay | Admitting: Family Medicine

## 2020-05-09 DIAGNOSIS — N951 Menopausal and female climacteric states: Secondary | ICD-10-CM

## 2020-08-03 ENCOUNTER — Telehealth: Payer: Self-pay

## 2020-08-03 DIAGNOSIS — N951 Menopausal and female climacteric states: Secondary | ICD-10-CM

## 2020-08-03 MED ORDER — VENLAFAXINE HCL ER 37.5 MG PO CP24: 37.5000 mg | ORAL_CAPSULE | Freq: Every day | ORAL | 0 refills | Status: DC

## 2020-08-03 NOTE — Telephone Encounter (Signed)
Rhonda Stein would like to come off the Venlafaxine. She takes them for hot flashes. She wanted to know how to step down. Please advise.

## 2020-08-03 NOTE — Telephone Encounter (Signed)
Drop down to 37.37m daily x 20 days then one every other day for 20 days and then stop. New rx sent to pharmacy

## 2020-08-04 NOTE — Telephone Encounter (Signed)
Patient advised.

## 2020-10-06 ENCOUNTER — Other Ambulatory Visit: Payer: Self-pay | Admitting: Family Medicine

## 2020-10-09 ENCOUNTER — Other Ambulatory Visit: Payer: Self-pay | Admitting: Family Medicine

## 2021-07-31 IMAGING — DX DG HIP (WITH OR WITHOUT PELVIS) 2-3V*R*
3 series · 3 of 3 positions shown · non-contrast
Comparison: None.

CLINICAL DATA: Right hip pain

EXAM:
DG HIP (WITH OR WITHOUT PELVIS) 2-3V RIGHT

[pelvis ap]
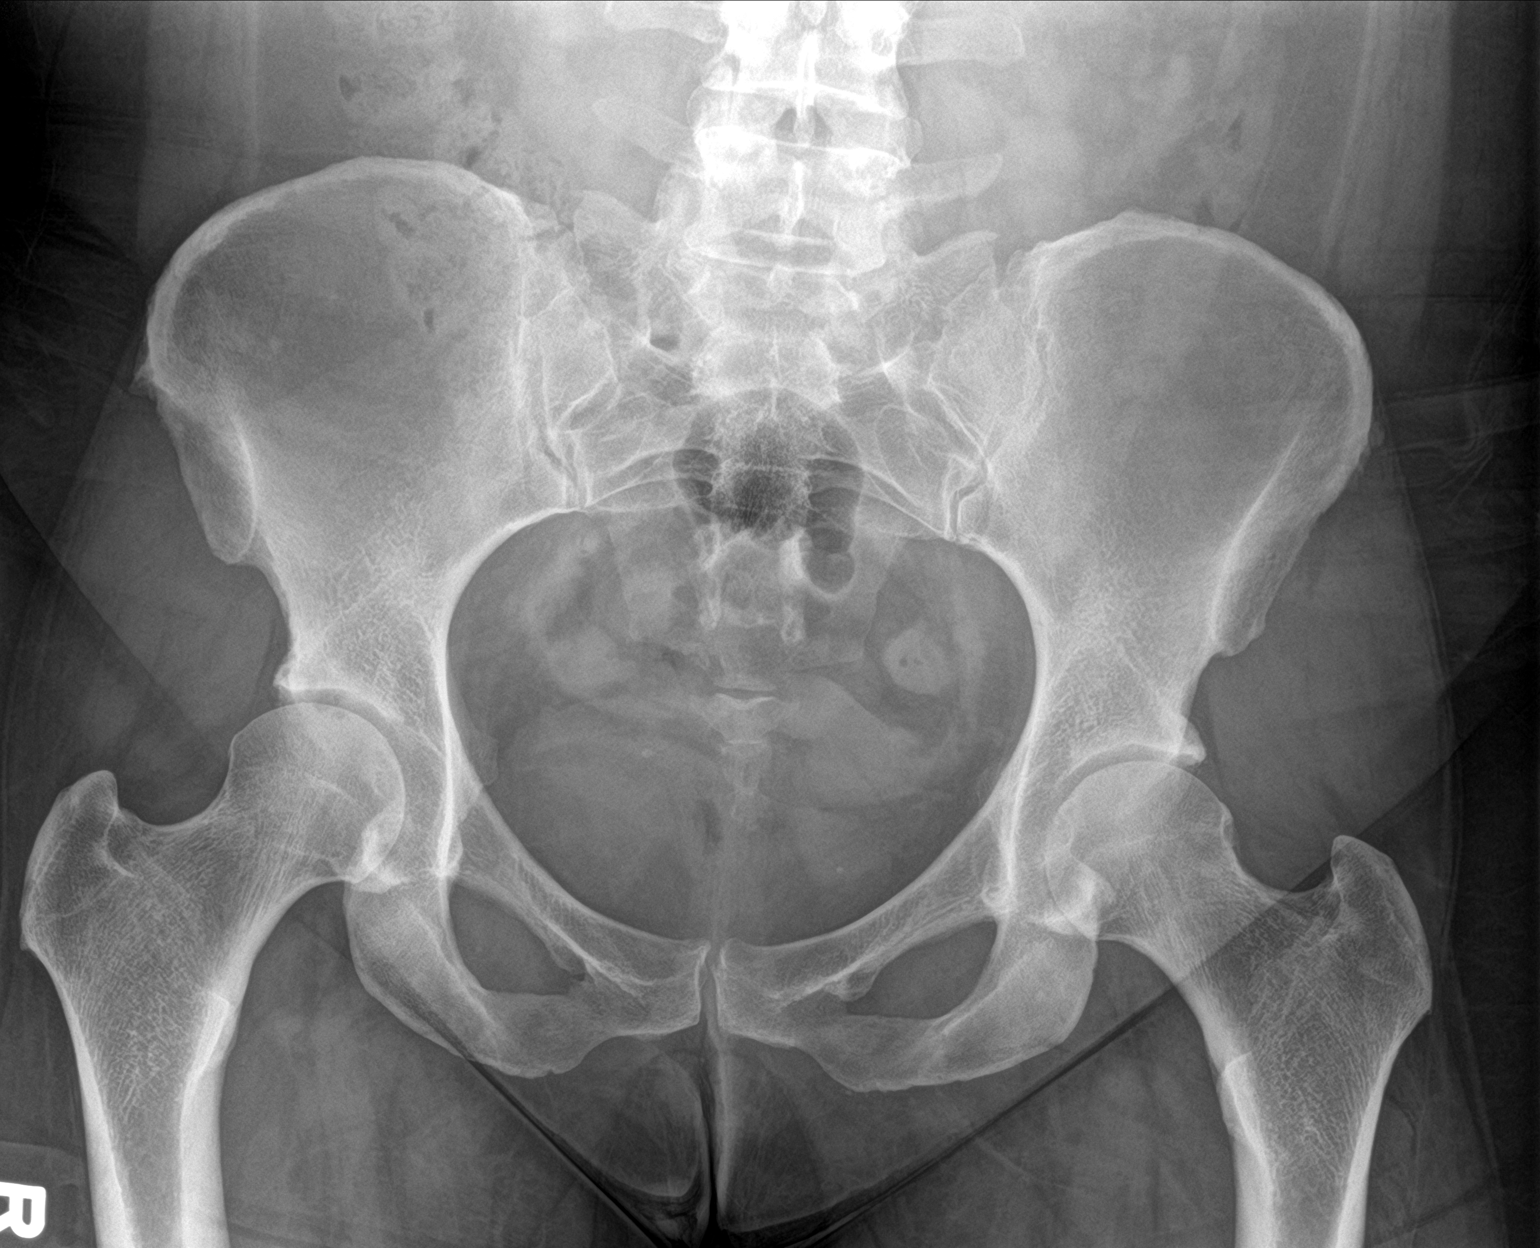

[hip ap]
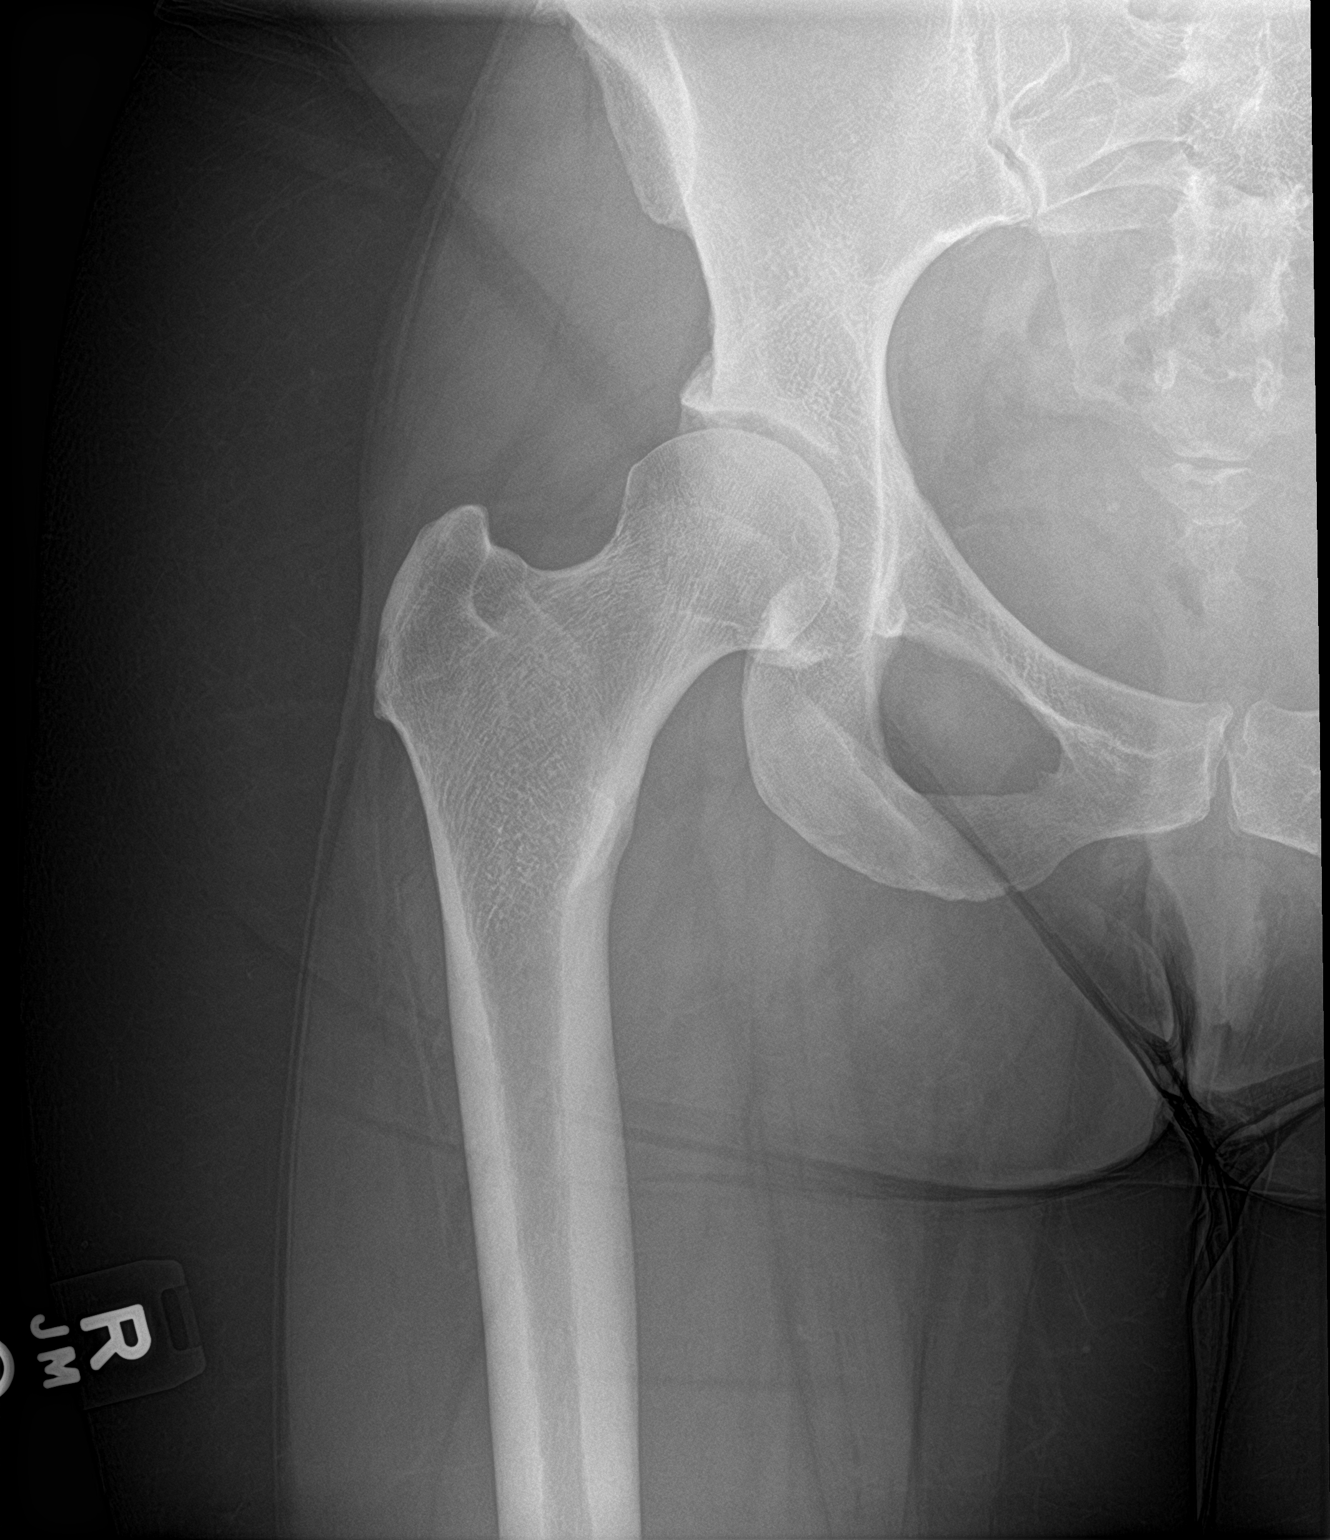

[hip frog leg]
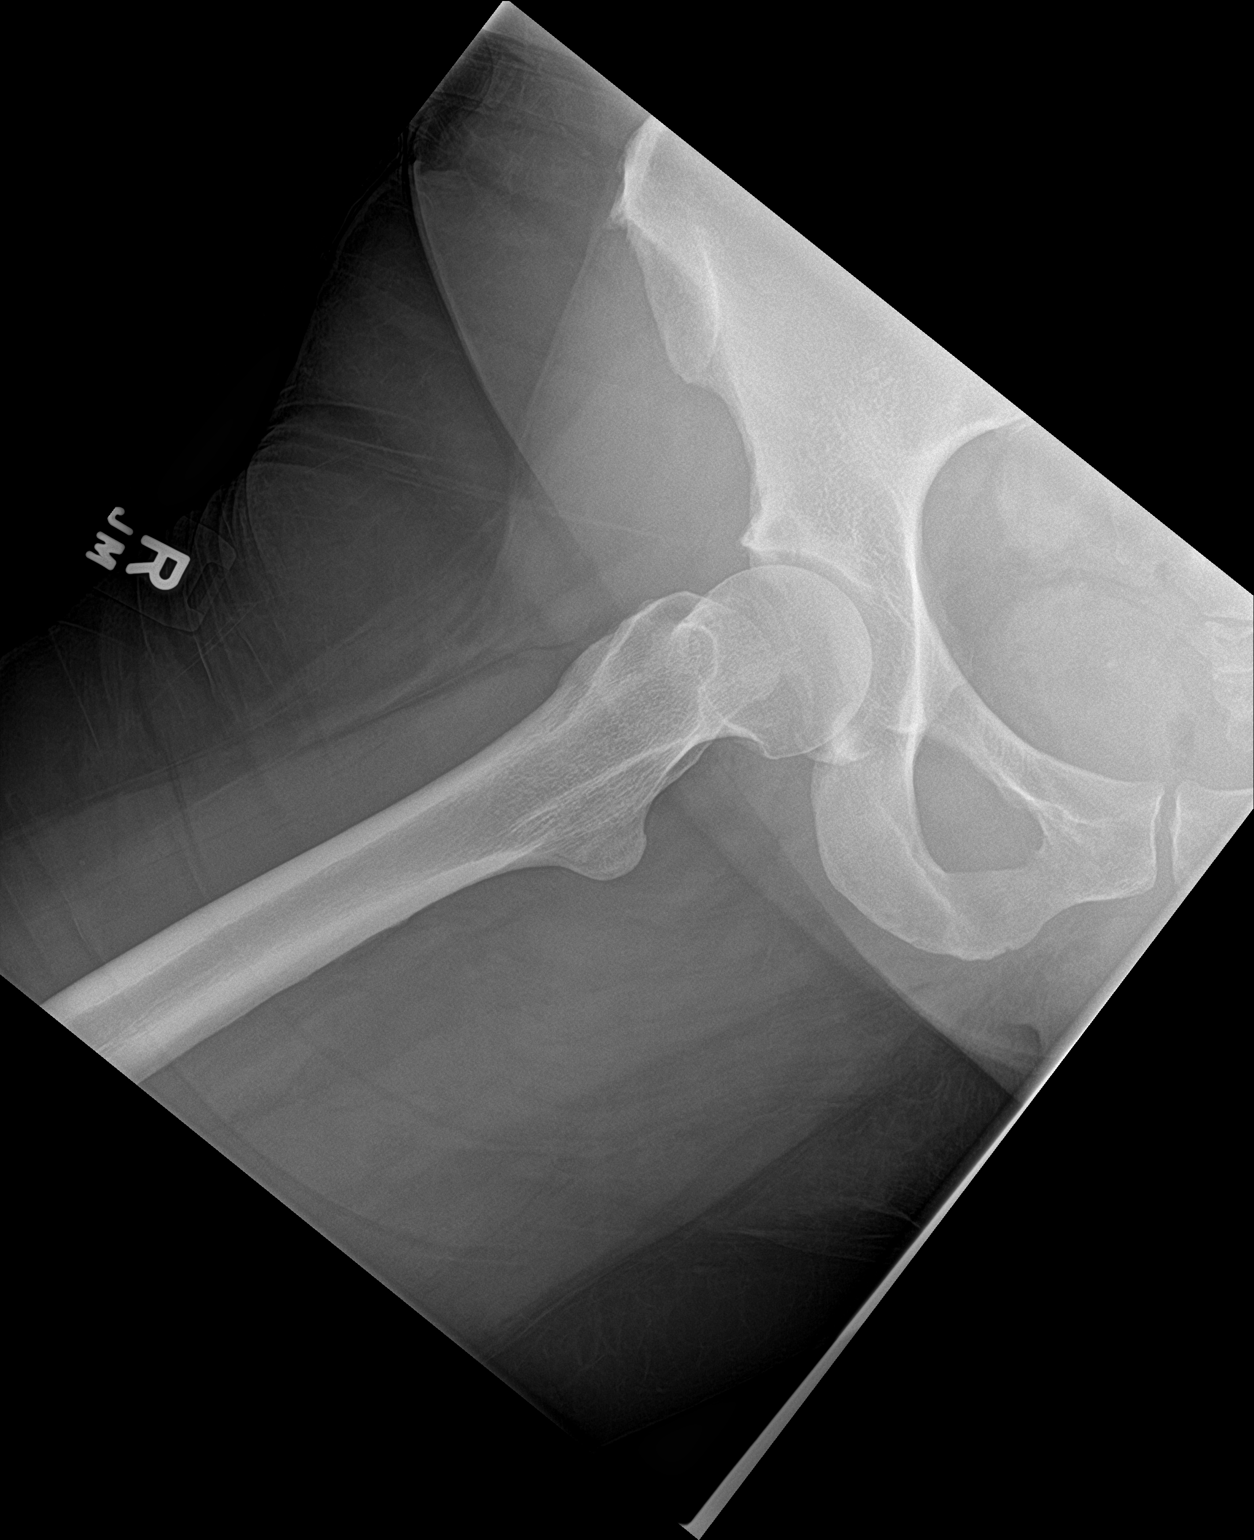

[3 of 3 positions shown; findings below may reference images not displayed]

FINDINGS: There is no acute displaced fracture or dislocation. There are
minimal degenerative changes of both hips. The osseous
mineralization is within normal limits.
IMPRESSION: No acute osseous abnormality. Minimal degenerative changes
bilaterally.

## 2021-08-07 IMAGING — MR MR HIP*R* W/CM
5 series · 40 of 40 positions shown · IV contrast (agent unspecified)
Comparison: Right hip radiographs 09/01/2019.

CLINICAL DATA: Right hip pain for 5 months. No acute injury or
prior relevant surgery. Labral tear suspected.

EXAM:
MRI OF THE RIGHT HIP WITH CONTRAST (MR Arthrogram)
TECHNIQUE: Multiplanar, multisequence MR imaging of the hip was performed
following contrast injection into the hip joint by the provider. No
intravenous contrast was administered.

[Series 4: T1 fat-sat · coronal · 4.0mm · 0.56mm/px · 7 of 25 slices shown (1 of 3)]
[im 1/25]
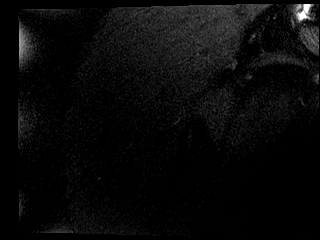
[im 5/25]
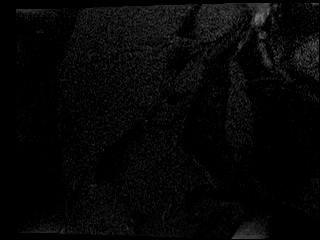
[im 9/25]
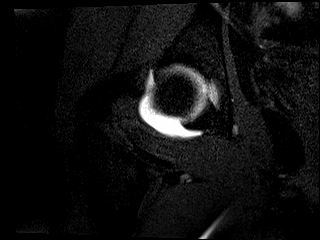
[im 13/25]
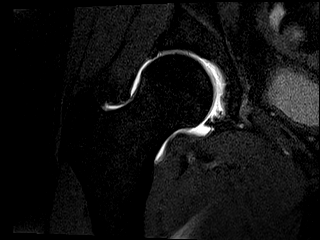
[im 17/25]
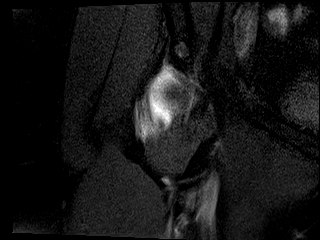
[im 21/25]
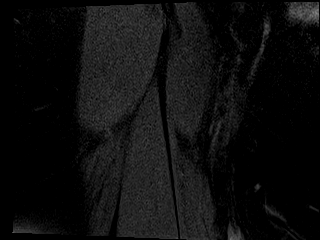
[im 25/25]
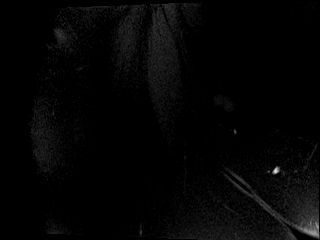

[Series 5: T1 fat-sat · sagittal · 4.0mm · 0.56mm/px · 7 of 24 slices shown (2 of 3)]
[im 1/24]
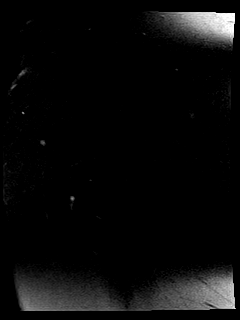
[im 4/24]
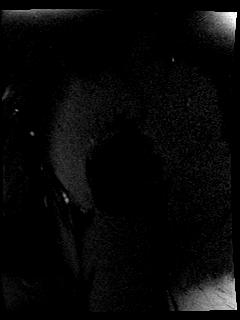
[im 8/24]
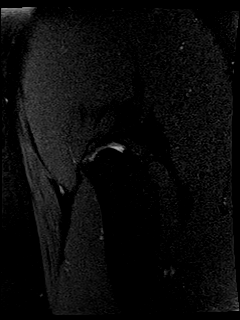
[im 12/24]
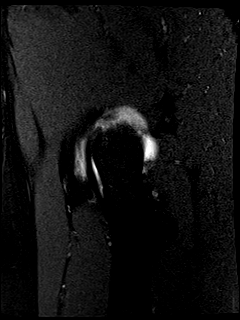
[im 16/24]
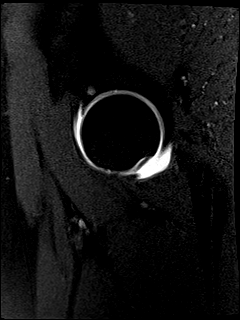
[im 20/24]
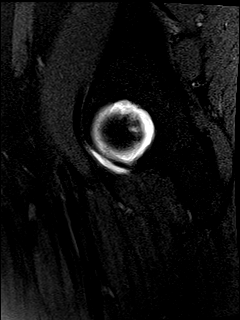
[im 24/24]
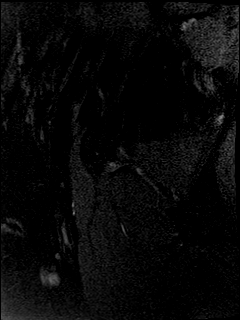

[Series 6: T1 fat-sat · axial · 4.0mm · 0.47mm/px · z∈[-101,-12]mm · 6 of 21 slices shown (3 of 3)]
[im 1/21]
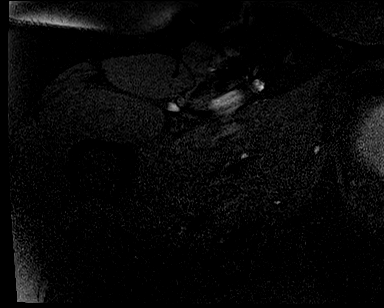
[im 5/21]
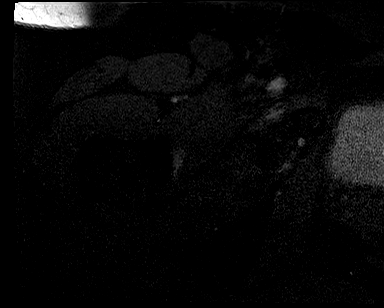
[im 9/21]
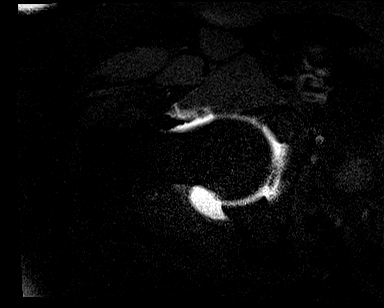
[im 13/21]
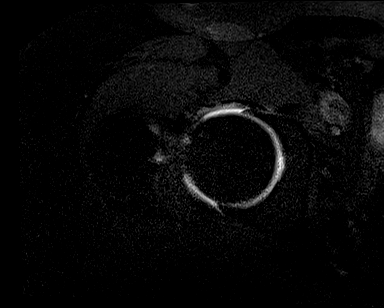
[im 17/21]
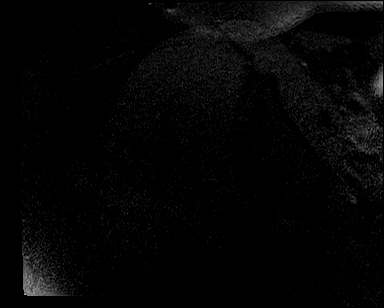
[im 21/21]
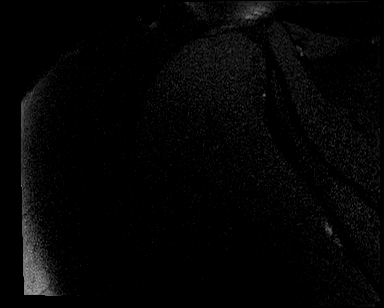

[Series 7: T1 · coronal · 4.0mm · 0.85mm/px · 10 of 35 slices shown]
[im 1/35]
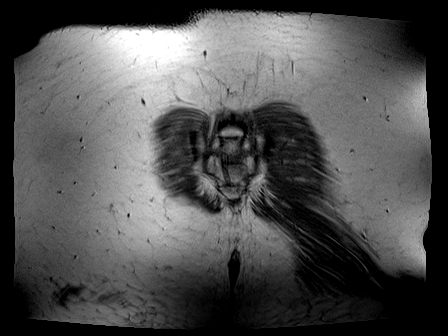
[im 4/35]
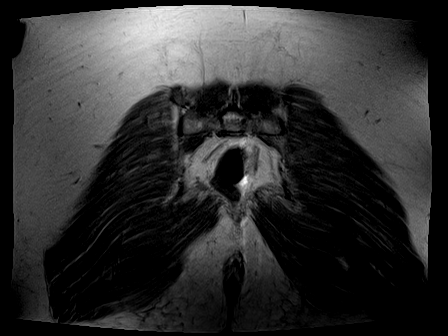
[im 8/35]
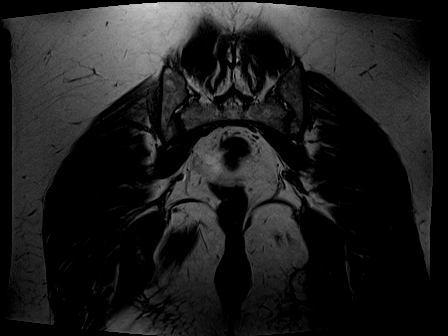
[im 12/35]
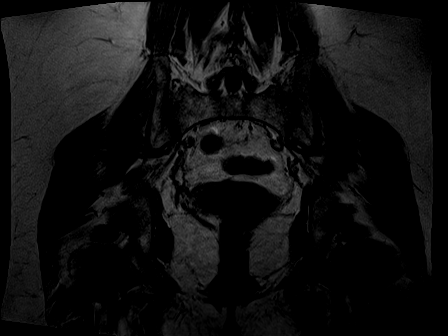
[im 16/35]
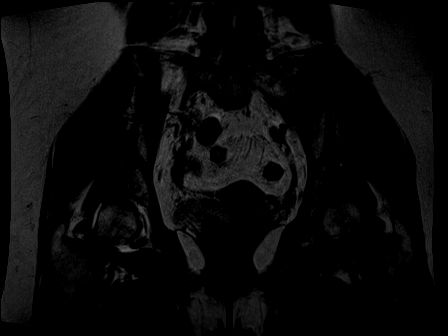
[im 19/35]
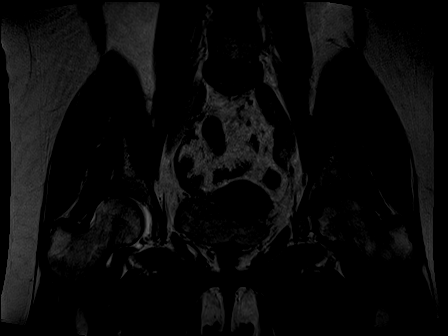
[im 23/35]
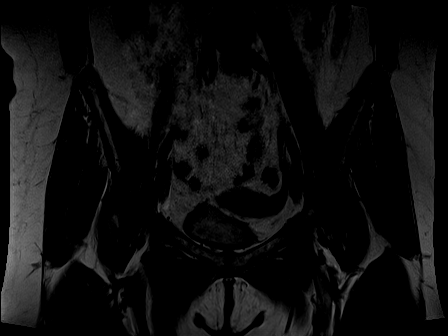
[im 27/35]
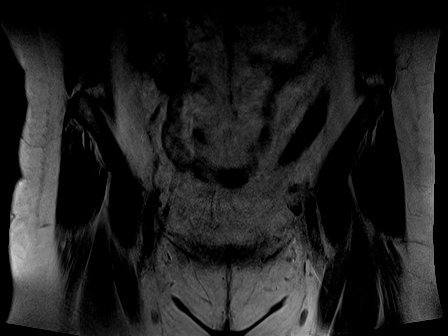
[im 31/35]
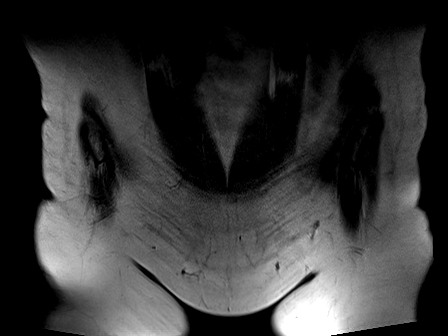
[im 35/35]
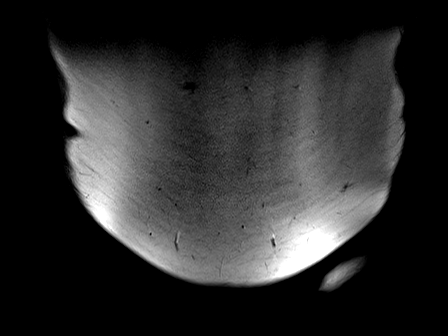

[Series 8: T2 fat-sat · coronal · 4.0mm · 0.99mm/px · 10 of 35 slices shown]
[im 1/35]
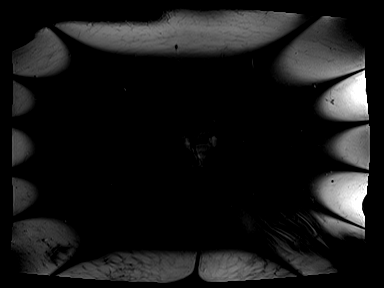
[im 4/35]
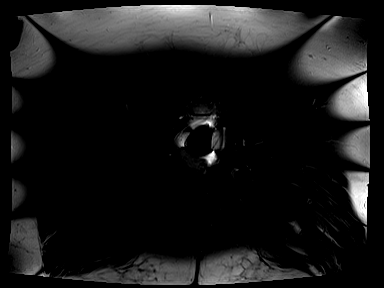
[im 8/35]
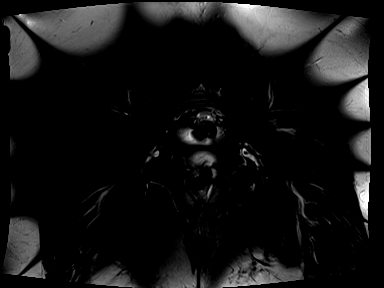
[im 12/35]
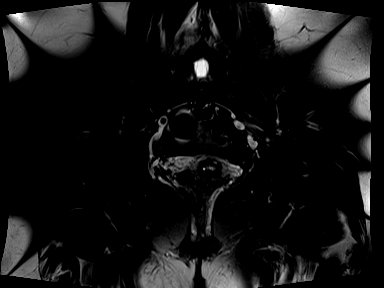
[im 16/35]
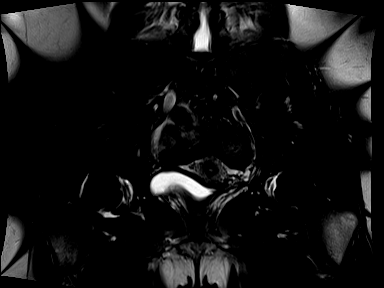
[im 19/35]
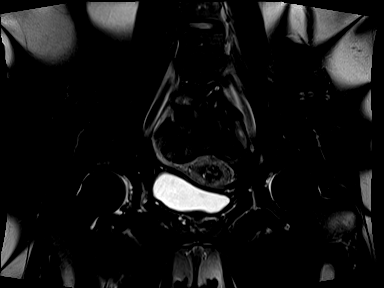
[im 23/35]
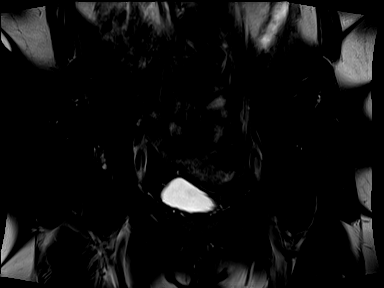
[im 27/35]
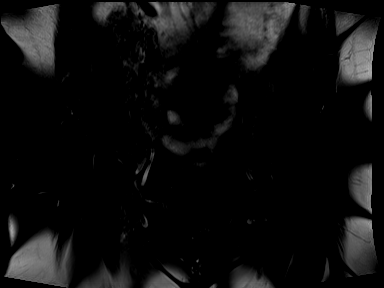
[im 31/35]
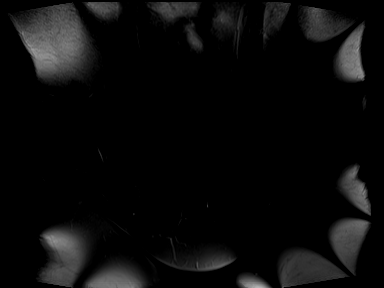
[im 35/35]
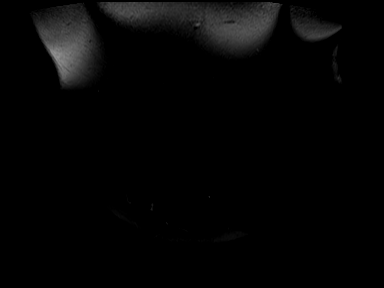

[40 of 40 positions shown; findings below may reference images not displayed]

FINDINGS: Bones: There is no evidence of acute fracture, dislocation or
avascular necrosis. The visualized bony pelvis appears normal. The
visualized sacroiliac joints and symphysis pubis appear normal.

Articular cartilage and labrum

Articular cartilage: There is asymmetric subchondral
cyst/intraosseous ganglion formation anteriorly in the right
superior acetabulum with surrounding bone marrow edema. This
measures up to 13 mm on sagittal image [DATE] and demonstrates
increased T1 and T2 signal, suggesting communication with the joint.
Mild underlying right hip degenerative changes with chondral
thinning but no focal femoral head articular cartilage defect.

Labrum: Mild degeneration of the right superior acetabular labrum
without focal tear or paralabral cyst.

Joint or bursal effusion

Joint effusion: Adequate distention of the right hip joint with the
contrast. No evidence of intra-articular loose body.

Bursae: No focal periarticular fluid collection.

Muscles and tendons

Muscles and tendons: The visualized gluteus, hamstring and iliopsoas
tendons appear normal. The piriformis muscles appear symmetric.

Other findings

Miscellaneous: Mild disc desiccation noted at L5-S1. The visualized
internal pelvic contents demonstrate no significant findings.
IMPRESSION: 1. Mild right hip degenerative changes with prominent subchondral
cyst/intraosseous ganglion formation anteriorly in the right
superior acetabulum.
2. Mild degeneration of the right superior acetabular labrum without
focal tear.
3. No acute osseous or tendinous abnormalities.

## 2021-10-21 ENCOUNTER — Emergency Department
Admission: EM | Admit: 2021-10-21 | Discharge: 2021-10-21 | Disposition: A | Discharge status: Home / Self Care | Payer: BC Managed Care – PPO | Source: Home / Self Care

## 2021-10-21 ENCOUNTER — Other Ambulatory Visit: Payer: Self-pay

## 2021-10-21 DIAGNOSIS — R059 Cough, unspecified: Secondary | ICD-10-CM | POA: Diagnosis not present

## 2021-10-21 DIAGNOSIS — J309 Allergic rhinitis, unspecified: Secondary | ICD-10-CM

## 2021-10-21 MED ORDER — PREDNISONE 20 MG PO TABS: ORAL_TABLET | ORAL | 0 refills | Status: DC

## 2021-10-21 MED ORDER — AMOXICILLIN-POT CLAVULANATE 875-125 MG PO TABS: 1.0000 | ORAL_TABLET | Freq: Two times a day (BID) | ORAL | 0 refills | Status: DC

## 2021-10-21 MED ORDER — FEXOFENADINE HCL 180 MG PO TABS: 180.0000 mg | ORAL_TABLET | Freq: Every day | ORAL | 0 refills | Status: DC

## 2021-10-21 NOTE — Discharge Instructions (Addendum)
Advised patient to take medication as directed with food to completion.  Advised patient to take Prednisone and Allegra with first dose of Augmentin for the next 5 of 7 days.  Encouraged patient increase daily water intake while taking these medications.

## 2021-10-21 NOTE — ED Provider Notes (Signed)
[facility]    CSN: [id number] Arrival date & time: 10/21/21  1549      History   Chief Complaint Chief Complaint  Patient presents with   Cough    Cough, low grade fever, loss of appetite and crackling of chest. x10    HPI Rhonda Stein is a 45 y.o. female.   HPI 45 year old female presents with cough low-grade fever last of appetite and crackling in her chest for 10 days.  PMH significant for obesity and chronic cough.  Past Medical History:  Diagnosis Date   Depression    Obesity     Patient Active Problem List   Diagnosis Date Noted   Frequent urination 10/09/2019   Primary osteoarthritis of right hip 09/01/2019   Chronic cough 11/01/2016   Rosacea, acne 07/13/2015   OBESITY, CLASS I 04/26/2010   Depression 04/26/2010    History reviewed. No pertinent surgical history.  OB History   No obstetric history on file.      Home Medications    Prior to Admission medications   Medication Sig Start Date End Date Taking? Authorizing Provider  amoxicillin-clavulanate (AUGMENTIN) 875-125 MG tablet Take 1 tablet by mouth every 12 (twelve) hours. 10/21/21  Yes Eliezer Lofts, FNP  fexofenadine [facility] ALLERGY) 180 MG tablet Take 1 tablet (180 mg total) by mouth daily for 15 days. 10/21/21 11/05/21 Yes Eliezer Lofts, FNP  predniSONE (DELTASONE) 20 MG tablet Take 3 tabs PO daily x 5 days. 10/21/21  Yes Eliezer Lofts, FNP  PROGESTERONE IM Inject into the muscle.   Yes [provider]  Melatonin 2.5 MG CAPS Take by mouth.    [provider]  oxybutynin (DITROPAN-XL) 5 MG 24 hr tablet TAKE ONE TABLET BY MOUTH TWICE A DAY 10/06/20   Hali Marry, MD  venlafaxine XR (EFFEXOR XR) 37.5 MG 24 hr capsule Take 1 capsule (37.5 mg total) by mouth daily with breakfast. 08/03/20   Hali Marry, MD    Family History History reviewed. No pertinent family history.  Social History Social History   Tobacco Use   Smoking status:  Never   Smokeless tobacco: Never  Substance Use Topics   Alcohol use: Yes    Comment: occ   Drug use: No     Allergies   Patient has no known allergies.   Review of Systems Review of Systems  Respiratory:  Positive for cough.   All other systems reviewed and are negative.   Physical Exam Triage Vital Signs ED Triage Vitals  Enc Vitals Group     BP 10/21/21 1607 (!) 133/93     Pulse Rate 10/21/21 1607 78     Resp 10/21/21 1607 20     Temp 10/21/21 1607 98.5 F (36.9 C)     Temp Source 10/21/21 1607 Oral     SpO2 10/21/21 1607 96 %     Weight 10/21/21 1604 193 lb (87.5 kg)     Height 10/21/21 1604 5' 8"  (1.727 m)     Head Circumference --      Peak Flow --      Pain Score 10/21/21 1604 4     Pain Loc --      Pain Edu? --      Excl. in [place]? --    No data found.  Updated Vital Signs BP (!) 133/93    Pulse 78    Temp 98.5 F (36.9 C) (Oral)    Resp 20    Ht 5' 8"  (  1.727 m)    Wt 193 lb (87.5 kg)    SpO2 96%    BMI 29.35 kg/m      Physical Exam Vitals and nursing note reviewed.  Constitutional:      General: She is not in acute distress.    Appearance: Normal appearance. She is obese. She is not ill-appearing.  HENT:     Right Ear: Tympanic membrane and external ear normal.     Left Ear: Tympanic membrane and external ear normal.     Ears:     Comments: Moderate eustachian tube dysfunction noted bilaterally    Mouth/Throat:     Mouth: Mucous membranes are moist.     Pharynx: Oropharynx is clear.     Comments: Moderate amount of clear drainage of posterior oropharynx noted Eyes:     Extraocular Movements: Extraocular movements intact.     Conjunctiva/sclera: Conjunctivae normal.     Pupils: Pupils are equal, round, and reactive to light.  Cardiovascular:     Rate and Rhythm: Normal rate and regular rhythm.     Pulses: Normal pulses.     Heart sounds: Normal heart sounds.  Pulmonary:     Effort: Pulmonary effort is normal.     Breath sounds: Wheezing  present.     Comments: Minimal mild diffuse rhonchi, with several inspiratory wheezes noted throughout Skin:    General: Skin is warm and dry.  Neurological:     General: No focal deficit present.     Mental Status: She is alert and oriented to person, place, and time.     UC Treatments / Results  Labs (all labs ordered are listed, but only abnormal results are displayed) Labs Reviewed - No data to display  EKG   Radiology No results found.  Procedures Procedures (including critical care time)  Medications Ordered in UC Medications - No data to display  Initial Impression / Assessment and Plan / UC Course  I have reviewed the triage vital signs and the nursing notes.  Pertinent labs & imaging results that were available during my care of the patient were reviewed by me and considered in my medical decision making (see chart for details).     MDM: 1. Cough-Rx'd Augmentin and Prednisone; 2.  Allergic rhinitis-Rx Allegra. Advised patient to take medication as directed with food to completion.  Advised patient to take Prednisone and Allegra with first dose of Augmentin for the next 5 of 7 days.  Encouraged patient increase daily water intake while taking these medications.  Patient discharged home, hemodynamically stable. Final Clinical Impressions(s) / UC Diagnoses   Final diagnoses:  Cough, unspecified type  Allergic rhinitis, unspecified seasonality, unspecified trigger     Discharge Instructions      Advised patient to take medication as directed with food to completion.  Advised patient to take Prednisone and Allegra with first dose of Augmentin for the next 5 of 7 days.  Encouraged patient increase daily water intake while taking these medications.     ED Prescriptions     Medication Sig Dispense Auth. Provider   amoxicillin-clavulanate (AUGMENTIN) 875-125 MG tablet Take 1 tablet by mouth every 12 (twelve) hours. 14 tablet Eliezer Lofts, FNP   predniSONE  (DELTASONE) 20 MG tablet Take 3 tabs PO daily x 5 days. 15 tablet Eliezer Lofts, FNP   fexofenadine [facility] ALLERGY) 180 MG tablet Take 1 tablet (180 mg total) by mouth daily for 15 days. 15 tablet Eliezer Lofts, FNP      PDMP  not reviewed this encounter.   Eliezer Lofts, [employer] 10/21/21 1626

## 2021-10-21 NOTE — ED Triage Notes (Signed)
Pt states that she has a cough, low grade fever, loss of appetite and crackling of chest. X10 days   Pt states that she has some abdominal pain as well.  Pt states that she is vaccinated for covid. Pt states that she hasn't had flu vaccine.

## 2021-11-01 ENCOUNTER — Other Ambulatory Visit (HOSPITAL_COMMUNITY)
Admission: RE | Admit: 2021-11-01 | Discharge: 2021-11-01 | Disposition: A | Discharge status: Home / Self Care | Payer: BC Managed Care – PPO | Source: Ambulatory Visit | Attending: Family Medicine | Admitting: Family Medicine

## 2021-11-01 ENCOUNTER — Other Ambulatory Visit: Payer: Self-pay

## 2021-11-01 ENCOUNTER — Ambulatory Visit (INDEPENDENT_AMBULATORY_CARE_PROVIDER_SITE_OTHER): Payer: BC Managed Care – PPO | Admitting: Family Medicine

## 2021-11-01 ENCOUNTER — Encounter: Payer: Self-pay | Admitting: Family Medicine

## 2021-11-01 VITALS — BP 133/81 | HR 89 | Wt 185.0 lb

## 2021-11-01 DIAGNOSIS — R21 Rash and other nonspecific skin eruption: Secondary | ICD-10-CM

## 2021-11-01 DIAGNOSIS — N760 Acute vaginitis: Secondary | ICD-10-CM | POA: Diagnosis not present

## 2021-11-01 DIAGNOSIS — Z124 Encounter for screening for malignant neoplasm of cervix: Secondary | ICD-10-CM

## 2021-11-01 DIAGNOSIS — Z0000 Encounter for general adult medical examination without abnormal findings: Secondary | ICD-10-CM

## 2021-11-01 DIAGNOSIS — Z1211 Encounter for screening for malignant neoplasm of colon: Secondary | ICD-10-CM

## 2021-11-01 DIAGNOSIS — B3731 Acute candidiasis of vulva and vagina: Secondary | ICD-10-CM

## 2021-11-01 DIAGNOSIS — K2101 Gastro-esophageal reflux disease with esophagitis, with bleeding: Secondary | ICD-10-CM

## 2021-11-01 DIAGNOSIS — Z1231 Encounter for screening mammogram for malignant neoplasm of breast: Secondary | ICD-10-CM

## 2021-11-01 LAB — WET PREP FOR TRICH, YEAST, CLUE
MICRO NUMBER:: 12856764
Specimen Quality: ADEQUATE

## 2021-11-01 MED ORDER — PANTOPRAZOLE SODIUM 40 MG PO TBEC: 40.0000 mg | DELAYED_RELEASE_TABLET | Freq: Two times a day (BID) | ORAL | 1 refills | Status: DC

## 2021-11-01 MED ORDER — TRIAMCINOLONE ACETONIDE 0.1 % EX CREA: 1.0000 "application " | TOPICAL_CREAM | Freq: Two times a day (BID) | CUTANEOUS | 0 refills | Status: DC

## 2021-11-01 MED ORDER — AZITHROMYCIN 250 MG PO TABS: ORAL_TABLET | ORAL | 0 refills | Status: AC

## 2021-11-01 MED ORDER — SUCRALFATE 1 G PO TABS: 1.0000 g | ORAL_TABLET | Freq: Three times a day (TID) | ORAL | 1 refills | Status: DC

## 2021-11-01 MED ORDER — FLUCONAZOLE 150 MG PO TABS: 150.0000 mg | ORAL_TABLET | ORAL | 0 refills | Status: DC

## 2021-11-01 NOTE — Progress Notes (Signed)
She also wanted to discuss some symptoms that she has been having specifically.  She says around December 20 her husband came home sick she was helping take care of him and then 2 days later she started to feel sick with flulike symptoms particularly chest congestion chest discomfort feeling achy.  By December 31 she just felt worse.  And was given a prescription for amoxicillin, steroids and encouraged to take over-the-counter Allegra which she did.  As some of her symptoms started to clear up she started to get "severe acid reflux" which discomfort in her mid chest area.  She also was having copious amounts of postnasal drip. On January 1 she started taking Prilosec 20 mg twice a day and also started using some Pepto-Bismol and Gaviscon.  That she went through an entire bottle of Pepto-Bismol.  The Gaviscon did seem to help. We talked to her sister who is a nurse who recommended starting some supplements which she did start this past Monday including zinc, vitamin C, emergency, vitamin D3, aloe vera supplement, oregano oil supplement, turremick supplement and mast gum.   She is also on HRT pellets through a weight loss clinic.   To address the severe reflux symptoms she is really made some changes.  She has cut out caffeine avoided greasy spicy foods avoiding fast food.  She is been eating small amounts more frequently etc.  And making some of these major changes to her diet she says she is actually felt incredibly better just overall in her health she is actually lost some weight she feels that her skin is better.  She also cut out alcohol she had been drinking more regularly and more heavily now that she and her husband are empty nesters she said it was a gradual change but she has cut that out completely and feels that is better.  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  GERD: Discussed cutting out a lot of the supplements things like vitamin C are very acidic.  Discontinue Prilosec and will switch to pantoprazole 40 mg twice a day.  We will also add Carafate to coat the lining of the stomach before eating and at bedtime just for short period of time to see if that is helpful as well.  She is already made some great dietary changes and cut back to smaller portions.  She is also been sleeping on an incline but it has really impacted her sleep quality because she just does not feel comfortable sleeping that way.  Consider that she could also have thrush in her esophagus so we will consider treating with Diflucan.  Also consider that she could have an ulcer.  For her postnasal drip she has been doing a Nettie pot and some Afrin and running her humidifier.  Discontinue Afrin after 3 days he use.  Vaginal yeast infection-we will treat with Diflucan.

## 2021-11-01 NOTE — Progress Notes (Signed)
Subjective:     Rhonda Stein is a 46 y.o. female and is here for a comprehensive physical exam. The patient reports problems - not feeling well .   Also notes a round circular flat rash on the dorsum of her left hand she says its been there for a few days.  She says it is very itchy.  Social History   Socioeconomic History   Marital status: Married    Spouse name: [name]   Number of children: 2   Years of education: Not on file   Highest education level: Not on file  Occupational History   Occupation: self employed   Tobacco Use   Smoking status: Never   Smokeless tobacco: Never  Substance and Sexual Activity   Alcohol use: Yes    Comment: occ   Drug use: No   Sexual activity: Not on file  Other Topics Concern   Not on file  Social History Narrative   Has been working out.     Social Determinants of Health   Financial Resource Strain: Not on file  Food Insecurity: Not on file  Transportation Needs: Not on file  Physical Activity: Not on file  Stress: Not on file  Social Connections: Not on file  Intimate Partner Violence: Not on file   Health Maintenance  Topic Date Due   COVID-19 Vaccine (1) Never done   Hepatitis C Screening  Never done   COLONOSCOPY (Pts 45-3yr Insurance coverage will need to be confirmed)  Never done   INFLUENZA VACCINE  Never done   HIV Screening  11/24/2028 (Originally 10/20/1991)   TETANUS/TDAP  03/14/2022   PAP SMEAR-Modifier  04/30/2022   Pneumococcal Vaccine [id number] old  Aged Out   HPV VACCINES  Aged Out    The following portions of the patient's history were reviewed and updated as appropriate: allergies, current medications, past family history, past medical history, past social history, past surgical history, and problem list.  Review of Systems Pertinent items are noted in HPI.   Objective:    There were no vitals taken for this visit. General appearance: alert, cooperative, and appears stated age Head: Normocephalic,  without obvious abnormality, atraumatic Eyes:  conj clear, EOMI, PEERLA Ears: normal TM's and external ear canals both ears Nose: Nares normal. Septum midline. Mucosa normal. No drainage or sinus tenderness. Throat: lips, mucosa, and tongue normal; teeth and gums normal Neck: no adenopathy, no carotid bruit, no JVD, supple, symmetrical, trachea midline, and thyroid not enlarged, symmetric, no tenderness/mass/nodules Back: symmetric, no curvature. ROM normal. No CVA tenderness. Lungs: clear to auscultation bilaterally Breasts: normal appearance, no masses or tenderness Heart: regular rate and rhythm, S1, S2 normal, no murmur, click, rub or gallop Abdomen: soft, non-tender; bowel sounds normal; no masses,  no organomegaly Pelvic: cervix normal in appearance, no adnexal masses or tenderness, no cervical motion tenderness, rectovaginal septum normal, uterus normal size, shape, and consistency, and vulva is erythematous and swollen.  White discharge.   Extremities: extremities normal, atraumatic, no cyanosis or edema Pulses: 2+ and symmetric Skin: Skin color, texture, turgor normal. No rashes or lesions Lymph nodes: Cervical, supraclavicular, and axillary nodes normal. Neurologic: Grossly normal    Assessment:    Healthy female exam.      Plan:     See After Visit Summary for Counseling Recommendations  Keep up a regular exercise program and make sure you are eating a healthy diet Try to eat 4 servings of dairy a day, or if you are  lactose intolerant take a calcium with vitamin D daily.  Your vaccines are up to date.  Cologuard ordered.

## 2021-11-02 MED ORDER — METRONIDAZOLE 0.75 % VA GEL: 1.0000 | Freq: Two times a day (BID) | VAGINAL | 0 refills | Status: AC

## 2021-11-02 NOTE — Progress Notes (Signed)
Hi Rhonda Stein, interestingly the report shows no yeast, though I feel pretty confident that you do still have a yeast infection based on your exam.  But they did notice an overgrowth of bacteria.  Some also got a send over prescription for metronidazole gel to insert vaginally since you are taking the other orally.

## 2021-11-02 NOTE — Addendum Note (Signed)
Addended by: Beatrice Lecher D on: 11/02/2021 12:37 PM   Modules accepted: Orders

## 2021-11-03 ENCOUNTER — Telehealth: Payer: Self-pay

## 2021-11-03 LAB — CYTOLOGY - PAP
Comment: NEGATIVE
Diagnosis: NEGATIVE
High risk HPV: NEGATIVE

## 2021-11-03 MED ORDER — AMOXICILLIN-POT CLAVULANATE 875-125 MG PO TABS: 1.0000 | ORAL_TABLET | Freq: Two times a day (BID) | ORAL | 0 refills | Status: DC

## 2021-11-03 NOTE — Telephone Encounter (Signed)
Patient called stating she went to the pharmacy to pick up her prescriptions and was told by the pharmacist that two of the medications that was prescribed for her shows an interaction which can cause palpitations. Patient stated she took the medications and last night she noticed that her heart started racing. Patient stated she is unsure of which medications is causing her heart to race because she took them together after breakfast. Patient stated she did not take her medications today because she wanted to let Dr. Madilyn Fireman  know and she would like to know what should she do? Forward to Dr. Madilyn Fireman for review.

## 2021-11-03 NOTE — Telephone Encounter (Signed)
Patient advised of message and verbalized understanding.

## 2021-11-03 NOTE — Telephone Encounter (Signed)
OK we can change the zpack to augmentin.

## 2021-11-03 NOTE — Progress Notes (Signed)
Your Pap smear is normal. You are negative for HPV as well. Repeat pap smear in 5 years.

## 2021-11-09 ENCOUNTER — Ambulatory Visit: Payer: BC Managed Care – PPO

## 2021-11-15 ENCOUNTER — Ambulatory Visit (INDEPENDENT_AMBULATORY_CARE_PROVIDER_SITE_OTHER): Payer: BC Managed Care – PPO

## 2021-11-15 ENCOUNTER — Other Ambulatory Visit: Payer: Self-pay

## 2021-11-15 DIAGNOSIS — Z1231 Encounter for screening mammogram for malignant neoplasm of breast: Secondary | ICD-10-CM | POA: Diagnosis not present

## 2021-11-15 NOTE — Progress Notes (Signed)
HI Rhonda Stein,  They did see a possible asymmetry in the left breast and so did recommend further evaluation with a diagnostic mammogram and possibly an ultrasound.  The imaging department will be contacting you soon to get that scheduled.

## 2021-11-16 ENCOUNTER — Ambulatory Visit: Payer: BC Managed Care – PPO

## 2021-11-16 ENCOUNTER — Other Ambulatory Visit: Payer: Self-pay | Admitting: Family Medicine

## 2021-11-16 DIAGNOSIS — R928 Other abnormal and inconclusive findings on diagnostic imaging of breast: Secondary | ICD-10-CM

## 2021-11-18 ENCOUNTER — Other Ambulatory Visit: Payer: Self-pay | Admitting: Family Medicine

## 2021-11-18 DIAGNOSIS — R928 Other abnormal and inconclusive findings on diagnostic imaging of breast: Secondary | ICD-10-CM

## 2021-12-01 LAB — COLOGUARD: COLOGUARD: NEGATIVE

## 2021-12-01 NOTE — Progress Notes (Signed)
Great news! Your Cologuard test is negative.  Recommend repeat colon cancer screening in 3 years.

## 2021-12-13 ENCOUNTER — Ambulatory Visit
Admission: RE | Admit: 2021-12-13 | Discharge: 2021-12-13 | Disposition: A | Discharge status: Home / Self Care | Payer: BC Managed Care – PPO | Source: Ambulatory Visit | Attending: Family Medicine | Admitting: Family Medicine

## 2021-12-13 ENCOUNTER — Other Ambulatory Visit: Payer: Self-pay | Admitting: Family Medicine

## 2021-12-13 ENCOUNTER — Ambulatory Visit
Admission: RE | Admit: 2021-12-13 | Discharge: 2021-12-13 | Disposition: A | Discharge status: Home / Self Care | Payer: PRIVATE HEALTH INSURANCE | Source: Ambulatory Visit | Attending: Family Medicine | Admitting: Family Medicine

## 2021-12-13 DIAGNOSIS — R928 Other abnormal and inconclusive findings on diagnostic imaging of breast: Secondary | ICD-10-CM

## 2021-12-13 DIAGNOSIS — R922 Inconclusive mammogram: Secondary | ICD-10-CM | POA: Diagnosis not present

## 2021-12-30 ENCOUNTER — Other Ambulatory Visit: Payer: Self-pay | Admitting: Family Medicine

## 2022-01-05 ENCOUNTER — Other Ambulatory Visit: Payer: Self-pay | Admitting: Family Medicine

## 2022-06-13 ENCOUNTER — Ambulatory Visit
Admission: RE | Admit: 2022-06-13 | Discharge: 2022-06-13 | Disposition: A | Discharge status: Home / Self Care | Payer: BC Managed Care – PPO | Source: Ambulatory Visit | Attending: Family Medicine | Admitting: Family Medicine

## 2022-06-13 ENCOUNTER — Ambulatory Visit
Admission: RE | Admit: 2022-06-13 | Discharge: 2022-06-13 | Disposition: A | Discharge status: Home / Self Care | Payer: PRIVATE HEALTH INSURANCE | Source: Ambulatory Visit | Attending: Family Medicine | Admitting: Family Medicine

## 2022-06-13 DIAGNOSIS — R928 Other abnormal and inconclusive findings on diagnostic imaging of breast: Secondary | ICD-10-CM

## 2023-10-03 ENCOUNTER — Encounter: Payer: Self-pay | Admitting: Emergency Medicine

## 2023-10-03 ENCOUNTER — Ambulatory Visit
Admission: EM | Admit: 2023-10-03 | Discharge: 2023-10-03 | Disposition: A | Discharge status: Home / Self Care | Payer: No Typology Code available for payment source | Attending: Family Medicine | Admitting: Family Medicine

## 2023-10-03 DIAGNOSIS — J0141 Acute recurrent pansinusitis: Secondary | ICD-10-CM | POA: Diagnosis not present

## 2023-10-03 DIAGNOSIS — K219 Gastro-esophageal reflux disease without esophagitis: Secondary | ICD-10-CM

## 2023-10-03 DIAGNOSIS — H6123 Impacted cerumen, bilateral: Secondary | ICD-10-CM

## 2023-10-03 MED ORDER — FLUCONAZOLE 150 MG PO TABS: 150.0000 mg | ORAL_TABLET | ORAL | 0 refills | Status: DC

## 2023-10-03 MED ORDER — PREDNISONE 20 MG PO TABS: 40.0000 mg | ORAL_TABLET | Freq: Every day | ORAL | 0 refills | Status: DC

## 2023-10-03 MED ORDER — AMOXICILLIN-POT CLAVULANATE 875-125 MG PO TABS: 1.0000 | ORAL_TABLET | Freq: Two times a day (BID) | ORAL | 0 refills | Status: DC

## 2023-10-03 MED ORDER — FLUTICASONE PROPIONATE 50 MCG/ACT NA SUSP: 2.0000 | Freq: Every day | NASAL | 0 refills | Status: DC

## 2023-10-03 NOTE — ED Provider Notes (Signed)
[facility]    CSN: [id number] Arrival date & time: 10/03/23  2130      History   Chief Complaint Chief Complaint  Patient presents with   Nasal Post Drip    HPI Rhonda Stein is a 47 y.o. female.   HPI  Patient states that she has nasal congestion, postnasal drip, sinus pressure and pain, a lot of mucus down her throat that is causing an increase in her reflux symptoms.  Cough and chest congestion.  Chest hurts with coughing.  She states that it is worsening over several days.  She states she has had this before and it required antibiotics to go away.  She is heading to [facility] on vacation next week and really wants to be feeling better.  We did discuss most of the symptoms started off as a virus and antibiotics may not help at this point.  Past Medical History:  Diagnosis Date   Depression    Obesity     Patient Active Problem List   Diagnosis Date Noted   Frequent urination 10/09/2019   Primary osteoarthritis of right hip 09/01/2019   Chronic cough 11/01/2016   Rosacea, acne 07/13/2015   Overweight 04/26/2010   Depression 04/26/2010    History reviewed. No pertinent surgical history.  OB History   No obstetric history on file.      Home Medications    Prior to Admission medications   Medication Sig Start Date End Date Taking? Authorizing Provider  fluticasone (FLONASE) 50 MCG/ACT nasal spray Place 2 sprays into both nostrils daily. 10/03/23  Yes Eustace Moore, MD  predniSONE (DELTASONE) 20 MG tablet Take 2 tablets (40 mg total) by mouth daily with breakfast. 10/03/23  Yes Eustace Moore, MD  progesterone (PROMETRIUM) 100 MG capsule 100 mg See admin instructions. 03/29/21  Yes [provider]  amoxicillin-clavulanate (AUGMENTIN) 875-125 MG tablet Take 1 tablet by mouth 2 (two) times daily. 10/03/23   Eustace Moore, MD  fluconazole (DIFLUCAN) 150 MG tablet Take 1 tablet (150 mg total) by mouth every other day. 10/03/23   Eustace Moore, MD  sucralfate (CARAFATE) 1 g tablet Take 1 tablet (1 g total) by mouth 4 (four) times daily -  with meals and at bedtime. 11/01/21   Agapito Games, MD    Family History Family History  Problem Relation Age of Onset   Healthy Mother     Social History Social History   Tobacco Use   Smoking status: Never   Smokeless tobacco: Never  Vaping Use   Vaping status: Never Used  Substance Use Topics   Alcohol use: Yes    Comment: occ   Drug use: No     Allergies   Patient has no known allergies.   Review of Systems Review of Systems See HPI  Physical Exam Triage Vital Signs ED Triage Vitals  Encounter Vitals Group     BP 10/03/23 0833 120/77     Systolic BP Percentile --      Diastolic BP Percentile --      Pulse Rate 10/03/23 0833 70     Resp 10/03/23 0833 18     Temp 10/03/23 0833 98.2 F (36.8 C)     Temp Source 10/03/23 0833 Oral     SpO2 10/03/23 0833 98 %     Weight 10/03/23 0834 179 lb (81.2 kg)     Height 10/03/23 0834 5\' 8"  (1.727 m)     Head Circumference --  Peak Flow --      Pain Score 10/03/23 0834 0     Pain Loc --      Pain Education --      Exclude from Growth Chart --    No data found.  Updated Vital Signs BP 120/77 (BP Location: Right Arm)   Pulse 70   Temp 98.2 F (36.8 C) (Oral)   Resp 18   Ht 5\' 8"  (1.727 m)   Wt 81.2 kg   SpO2 98%   BMI 27.22 kg/m       Physical Exam Constitutional:      General: She is not in acute distress.    Appearance: She is well-developed. She is ill-appearing.  HENT:     Head: Normocephalic and atraumatic.     Right Ear: Tympanic membrane and ear canal normal. There is impacted cerumen.     Left Ear: Tympanic membrane and ear canal normal. There is impacted cerumen.     Ears:     Comments: Initially bilateral cerumen impaction.  After irrigation TMs appear clear    Nose: Congestion and rhinorrhea present.     Comments: Nasal membranes are swollen and red.  Thick yellow  drainage    Mouth/Throat:     Mouth: Mucous membranes are moist.     Pharynx: Posterior oropharyngeal erythema present.  Eyes:     Conjunctiva/sclera: Conjunctivae normal.     Pupils: Pupils are equal, round, and reactive to light.  Cardiovascular:     Rate and Rhythm: Normal rate and regular rhythm.     Heart sounds: Normal heart sounds.  Pulmonary:     Effort: Pulmonary effort is normal. No respiratory distress.     Breath sounds: Normal breath sounds.  Abdominal:     General: There is no distension.     Palpations: Abdomen is soft.  Musculoskeletal:        General: Normal range of motion.     Cervical back: Normal range of motion.  Lymphadenopathy:     Cervical: Cervical adenopathy present.  Skin:    General: Skin is warm and dry.  Neurological:     Mental Status: She is alert.      UC Treatments / Results  Labs (all labs ordered are listed, but only abnormal results are displayed) Labs Reviewed - No data to display  EKG   Radiology No results found.  Procedures Procedures (including critical care time)  Medications Ordered in UC Medications - No data to display  Initial Impression / Assessment and Plan / UC Course  I have reviewed the triage vital signs and the nursing notes.  Pertinent labs & imaging results that were available during my care of the patient were reviewed by me and considered in my medical decision making (see chart for details).     Final Clinical Impressions(s) / UC Diagnoses   Final diagnoses:  Acute recurrent pansinusitis  Gastroesophageal reflux disease, unspecified whether esophagitis present  Bilateral hearing loss due to cerumen impaction     Discharge Instructions      For the reflux continue omeprazole once a day.  May take twice a day if needed and use the sucralfate as needed.  For the sinus infection and postnasal drip take prednisone 2 times a day for 2 days.  Take the antibiotic 2 times a day for 7 days. I have  prescribed Diflucan if needed for yeast symptoms (from antibiotic) Use Flonase daily until symptoms have resolved   ED Prescriptions  Medication Sig Dispense Auth. Provider   amoxicillin-clavulanate (AUGMENTIN) 875-125 MG tablet Take 1 tablet by mouth 2 (two) times daily. 14 tablet Eustace Moore, MD   fluconazole (DIFLUCAN) 150 MG tablet Take 1 tablet (150 mg total) by mouth every other day. 5 tablet Eustace Moore, MD   predniSONE (DELTASONE) 20 MG tablet Take 2 tablets (40 mg total) by mouth daily with breakfast. 10 tablet Eustace Moore, MD   fluticasone [facility]) 50 MCG/ACT nasal spray Place 2 sprays into both nostrils daily. 16 g Eustace Moore, MD      PDMP not reviewed this encounter.   Eustace Moore, MD 10/03/23 [phone]

## 2023-10-03 NOTE — ED Triage Notes (Signed)
Patient c/o post nasal drip that is causing her reflux to flare up, cough, congestion and stuffy ears x 4 days.  Patient has taken Omeprazole, Benadryl and Mucinex DM.

## 2023-10-03 NOTE — Discharge Instructions (Addendum)
For the reflux continue omeprazole once a day.  May take twice a day if needed and use the sucralfate as needed.  For the sinus infection and postnasal drip take prednisone 2 times a day for 2 days.  Take the antibiotic 2 times a day for 7 days. I have prescribed Diflucan if needed for yeast symptoms (from antibiotic) Use Flonase daily until symptoms have resolved

## 2023-10-25 ENCOUNTER — Ambulatory Visit
Admission: EM | Admit: 2023-10-25 | Discharge: 2023-10-25 | Disposition: A | Discharge status: Home / Self Care | Payer: No Typology Code available for payment source | Attending: Family Medicine | Admitting: Family Medicine

## 2023-10-25 ENCOUNTER — Encounter: Payer: Self-pay | Admitting: Emergency Medicine

## 2023-10-25 DIAGNOSIS — U071 COVID-19: Secondary | ICD-10-CM

## 2023-10-25 LAB — POC SARS CORONAVIRUS 2 AG -  ED: SARS Coronavirus 2 Ag: POSITIVE — AB

## 2023-10-25 MED ORDER — PAXLOVID (300/100) 20 X 150 MG & 10 X 100MG PO TBPK: ORAL_TABLET | ORAL | 0 refills | Status: DC

## 2023-10-25 NOTE — ED Provider Notes (Signed)
 [facility]    CSN: [id number] Arrival date & time: 10/25/23  1042      History   Chief Complaint Chief Complaint  Patient presents with   Cough    HPI Rhonda Stein is a 48 y.o. female.   Yesterday patient suddenly developed sore throat, sinus congestion, fatigue, headache, and cough.  Last night she had chills.  She denies pleuritic pain and shortness of breath.  Her elderly mother has had mild URI symptoms for about a week.  The history is provided by the patient.    Past Medical History:  Diagnosis Date   Depression    Obesity     Patient Active Problem List   Diagnosis Date Noted   Frequent urination 10/09/2019   Primary osteoarthritis of right hip 09/01/2019   Chronic cough 11/01/2016   Rosacea, acne 07/13/2015   Overweight 04/26/2010   Depression 04/26/2010    History reviewed. No pertinent surgical history.  OB History   No obstetric history on file.      Home Medications    Prior to Admission medications   Medication Sig Start Date End Date Taking? Authorizing Provider  fluticasone  (FLONASE ) 50 MCG/ACT nasal spray Place 2 sprays into both nostrils daily. 10/03/23  Yes Maranda Jamee Jacob, MD  nirmatrelvir/ritonavir (PAXLOVID , 300/100,) 20 x 150 MG & 10 x 100MG  TBPK Take nirmatrelvir (150 mg) two tablets twice daily for 5 days and ritonavir (100 mg) one tablet twice daily for 5 days. 10/25/23  Yes Pauline Garnette LABOR, MD  progesterone  (PROMETRIUM ) 100 MG capsule 100 mg See admin instructions. 03/29/21  Yes [provider]  amoxicillin -clavulanate (AUGMENTIN ) 875-125 MG tablet Take 1 tablet by mouth 2 (two) times daily. 10/03/23   Maranda Jamee Jacob, MD  fluconazole  (DIFLUCAN ) 150 MG tablet Take 1 tablet (150 mg total) by mouth every other day. 10/03/23   Maranda Jamee Jacob, MD  predniSONE  (DELTASONE ) 20 MG tablet Take 2 tablets (40 mg total) by mouth daily with breakfast. 10/03/23   Maranda Jamee Jacob, MD  sucralfate  (CARAFATE ) 1 g tablet  Take 1 tablet (1 g total) by mouth 4 (four) times daily -  with meals and at bedtime. 11/01/21   Alvan Dorothyann BIRCH, MD    Family History Family History  Problem Relation Age of Onset   Healthy Mother     Social History Social History   Tobacco Use   Smoking status: Never   Smokeless tobacco: Never  Vaping Use   Vaping status: Never Used  Substance Use Topics   Alcohol use: Yes    Comment: occ   Drug use: No     Allergies   Patient has no known allergies.   Review of Systems Review of Systems + sore throat + cough No pleuritic pain No wheezing + nasal congestion + post-nasal drainage + sinus pain/pressure No itchy/red eyes No earache No hemoptysis No SOB No fever, + chills No nausea No vomiting No abdominal pain No diarrhea No urinary symptoms No skin rash + fatigue No myalgias + headache Used OTC meds (Mucinex  DM) without relief   Physical Exam Triage Vital Signs ED Triage Vitals  Encounter Vitals Group     BP 10/25/23 1235 (!) 156/92     Systolic BP Percentile --      Diastolic BP Percentile --      Pulse Rate 10/25/23 1235 87     Resp 10/25/23 1235 18     Temp 10/25/23 1235 99.2 F (37.3 C)  Temp Source 10/25/23 1235 Oral     SpO2 10/25/23 1235 96 %     Weight 10/25/23 1238 178 lb (80.7 kg)     Height 10/25/23 1238 5' 8 (1.727 m)     Head Circumference --      Peak Flow --      Pain Score 10/25/23 1237 3     Pain Loc --      Pain Education --      Exclude from Growth Chart --    No data found.  Updated Vital Signs BP (!) 156/92 (BP Location: Right Arm)   Pulse 87   Temp 99.2 F (37.3 C) (Oral)   Resp 18   Ht 5' 8 (1.727 m)   Wt 80.7 kg   SpO2 96%   BMI 27.06 kg/m   Visual Acuity Right Eye Distance:   Left Eye Distance:   Bilateral Distance:    Right Eye Near:   Left Eye Near:    Bilateral Near:     Physical Exam Nursing notes and Vital Signs reviewed. Appearance:  Patient appears stated age, and in no acute  distress Eyes:  Pupils are equal, round, and reactive to light and accomodation.  Extraocular movement is intact.  Conjunctivae are not inflamed  Ears:  Canals normal.  Tympanic membranes normal.  Nose:  Mildly congested turbinates.  No sinus tenderness.  Pharynx:  Normal Neck:  Supple.  Mildly enlarged lateral nodes are present, tender to palpation on the left.   Lungs:  Clear to auscultation.  Breath sounds are equal.  Moving air well. Heart:  Regular rate and rhythm without murmurs, rubs, or gallops.  Abdomen:  Nontender without masses or hepatosplenomegaly.  Bowel sounds are present.  No CVA or flank tenderness.  Extremities:  No edema.  Skin:  No rash present.   UC Treatments / Results  Labs (all labs ordered are listed, but only abnormal results are displayed) Labs Reviewed  POC SARS CORONAVIRUS 2 AG -  ED - Abnormal; Notable for the following components:      Result Value   SARS Coronavirus 2 Ag Positive (*)    All other components within normal limits    EKG   Radiology No results found.  Procedures Procedures (including critical care time)  Medications Ordered in UC Medications - No data to display  Initial Impression / Assessment and Plan / UC Course  I have reviewed the triage vital signs and the nursing notes.  Pertinent labs & imaging results that were available during my care of the patient were reviewed by me and considered in my medical decision making (see chart for details).    Begin Paxlovid . Followup with Family Doctor if not improved in five days.  Final Clinical Impressions(s) / UC Diagnoses   Final diagnoses:  COVID-19 virus infection     Discharge Instructions      Take plain guaifenesin  (1200mg  extended release tabs such as Mucinex ) twice daily, with plenty of water, for cough and congestion.  Get adequate rest.   May take Delsym Cough Suppressant (12 Hour Cough Relief) at bedtime for nighttime cough.  Try warm salt water gargles for  sore throat.  Stop all antihistamines for now, and other non-prescription cough/cold preparations.   If symptoms become significantly worse during the night or over the weekend, proceed to the local emergency room.     ED Prescriptions     Medication Sig Dispense Auth. Provider   nirmatrelvir/ritonavir (PAXLOVID , 300/100,) 20 x 150  MG & 10 x 100MG  TBPK Take nirmatrelvir (150 mg) two tablets twice daily for 5 days and ritonavir (100 mg) one tablet twice daily for 5 days. 30 tablet Pauline Garnette LABOR, MD         Pauline Garnette LABOR, MD 10/27/23 [phone]

## 2023-10-25 NOTE — ED Triage Notes (Signed)
 Patient c/o productive cough, nasal drainage, sinus pressure, congestion x 2 days.  Patient has taken Mucinex and Omeprazole.

## 2023-10-25 NOTE — Discharge Instructions (Signed)
Take plain guaifenesin (1200mg extended release tabs such as Mucinex) twice daily, with plenty of water, for cough and congestion. Get adequate rest.   May take Delsym Cough Suppressant ("12 Hour Cough Relief") at bedtime for nighttime cough.  Try warm salt water gargles for sore throat.  Stop all antihistamines for now, and other non-prescription cough/cold preparations.   If symptoms become significantly worse during the night or over the weekend, proceed to the local emergency room.  

## 2024-11-24 ENCOUNTER — Ambulatory Visit: Admitting: Family Medicine

## 2024-11-24 ENCOUNTER — Encounter: Payer: Self-pay | Admitting: Family Medicine

## 2024-11-24 VITALS — BP 124/72 | HR 66 | Ht 68.0 in | Wt 191.0 lb

## 2024-11-24 DIAGNOSIS — M1611 Unilateral primary osteoarthritis, right hip: Secondary | ICD-10-CM

## 2024-11-25 ENCOUNTER — Ambulatory Visit

## 2024-11-25 ENCOUNTER — Other Ambulatory Visit: Payer: Self-pay

## 2024-11-25 VITALS — BP 120/84 | Ht 68.0 in | Wt 191.0 lb

## 2024-11-25 DIAGNOSIS — M25551 Pain in right hip: Secondary | ICD-10-CM | POA: Diagnosis not present

## 2024-11-25 DIAGNOSIS — S73191D Other sprain of right hip, subsequent encounter: Secondary | ICD-10-CM | POA: Diagnosis not present

## 2024-11-25 MED ORDER — METHYLPREDNISOLONE ACETATE 40 MG/ML IJ SUSP
40.0000 mg | Freq: Once | INTRAMUSCULAR | Status: AC
  Administered 2024-11-25: 40 mg via INTRA_ARTICULAR

## 2024-11-25 NOTE — Progress Notes (Signed)
" ° °  Subjective:    Patient ID: Rhonda Stein, female    DOB: 49 y.o., 1976-08-23   MRN: [id number]  Chief Complaint: Right hip pain  History of Present Illness 49 year old female with past medical history significant for depression presenting for evaluation of right hip pain.  Previously evaluated by Dr. Curtis.  09/08/2019 hip MRI previously obtained did show labral fraying.  Intra-articular injection of steroid in 2020 provided complete relief. Feels like the same pain has recurred now but this has been a slow and gradual recurrence.  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    Objective:   Vitals:   11/25/24 0954  BP: 120/84   Right hip (compared to normal) -Inspection: No swelling or skin changes. No leg length discrepancy. No gait abnormalities with walking. -Palpation: TTP - level ASIS, - level AIIS, - adductor, - greater trochanter, - SI joint, - over piriformis -AROM/PROM: Flexion 120 deg, abduction 50 deg, adduction 30 deg (though painful), extension 15 deg, ER 60 deg, IR 30 deg (though painful), high hamstring flexibility -Strength: 5/5 flexion, 5/5 abduction, 5/5 adduction, 5/5 extension.  Weakness with gluteus medius testing. -Special tests: +  FADIR (localizes to her groin), -  log roll, equivocal Debby, - Ober, - Noble, - FABER, - piriformis testing, - SLR  Right intra-articular Hip Injection with Ultrasound Guidance Procedure Note Shyniece Scripter [birth year] Indications: Pain Procedure Details Following the description of risks including infection, bleeding, damage to surrounding structures, patient provided written consent for right hip joint injection procedure. US  was used to identify the femoral head-neck junction and doppler ultrasound was used to identify the nearby  vasculature. Patient was sterilely prepped in the usual fashion with alcohol.  Following topical anesthetization with ethyl chloride they were injected with a solution of 40mg  Depo-medrol  and 3cc Mepivacaine 2% via the anterior approach into the femoroacetabular joint capsule. This was well visualized under ultrasound, please see associated photographic documentation. Patient tolerated well without complication.  Precautions provided. Cleaned and dressing applied.    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  With a slow gradual recurrence of this exact same kind of pain, I believe that she is likely to benefit from a repeat intra-articular injection today.  We will place a referral to physical therapy as more of a maintenance for her, and provided an ultrasound-guided steroid injection in her right hip.  Follow-up as needed.   "

## 2024-12-04 ENCOUNTER — Ambulatory Visit: Admitting: Physical Therapy
